# Patient Record
Sex: Female | Born: 1965 | Race: White | Hispanic: No | Smoking: Former smoker
Health system: Southern US, Community
[De-identification: ages and names within clinical notes are randomized; demographics above are authoritative.]

## PROBLEM LIST (undated history)

## (undated) DIAGNOSIS — R2231 Localized swelling, mass and lump, right upper limb: Secondary | ICD-10-CM

## (undated) DIAGNOSIS — Z87442 Personal history of urinary calculi: Secondary | ICD-10-CM

## (undated) DIAGNOSIS — Z9889 Other specified postprocedural states: Secondary | ICD-10-CM

## (undated) DIAGNOSIS — R112 Nausea with vomiting, unspecified: Secondary | ICD-10-CM

## (undated) DIAGNOSIS — K219 Gastro-esophageal reflux disease without esophagitis: Secondary | ICD-10-CM

## (undated) DIAGNOSIS — Z973 Presence of spectacles and contact lenses: Secondary | ICD-10-CM

## (undated) HISTORY — PX: BREAST EXCISIONAL BIOPSY: SUR124

## (undated) HISTORY — PX: CERVICAL BIOPSY  W/ LOOP ELECTRODE EXCISION: SUR135

---

## 1997-10-29 ENCOUNTER — Other Ambulatory Visit: Admission: RE | Admit: 1997-10-29 | Discharge: 1997-10-29 | Payer: Self-pay | Admitting: Gynecology

## 1998-07-20 HISTORY — PX: OTHER SURGICAL HISTORY: SHX169

## 1998-11-10 ENCOUNTER — Other Ambulatory Visit: Admission: RE | Admit: 1998-11-10 | Discharge: 1998-11-10 | Payer: Self-pay | Admitting: Gynecology

## 1999-11-11 ENCOUNTER — Encounter: Admission: RE | Admit: 1999-11-11 | Discharge: 1999-11-11 | Payer: Self-pay | Admitting: Gynecology

## 1999-11-11 ENCOUNTER — Other Ambulatory Visit: Admission: RE | Admit: 1999-11-11 | Discharge: 1999-11-11 | Payer: Self-pay | Admitting: Gynecology

## 1999-11-11 ENCOUNTER — Encounter: Payer: Self-pay | Admitting: Gynecology

## 2000-07-19 ENCOUNTER — Encounter (INDEPENDENT_AMBULATORY_CARE_PROVIDER_SITE_OTHER): Payer: Self-pay | Admitting: Specialist

## 2000-07-19 ENCOUNTER — Ambulatory Visit (HOSPITAL_COMMUNITY): Admission: RE | Admit: 2000-07-19 | Discharge: 2000-07-19 | Payer: Self-pay | Admitting: Surgery

## 2000-11-13 ENCOUNTER — Other Ambulatory Visit: Admission: RE | Admit: 2000-11-13 | Discharge: 2000-11-13 | Payer: Self-pay | Admitting: Gynecology

## 2000-11-13 ENCOUNTER — Encounter: Admission: RE | Admit: 2000-11-13 | Discharge: 2000-11-13 | Payer: Self-pay | Admitting: Gynecology

## 2000-11-13 ENCOUNTER — Encounter: Payer: Self-pay | Admitting: Gynecology

## 2001-05-18 ENCOUNTER — Encounter: Payer: Self-pay | Admitting: Gynecology

## 2001-05-18 ENCOUNTER — Encounter: Admission: RE | Admit: 2001-05-18 | Discharge: 2001-05-18 | Payer: Self-pay | Admitting: Gynecology

## 2001-06-22 ENCOUNTER — Ambulatory Visit (HOSPITAL_COMMUNITY): Admission: RE | Admit: 2001-06-22 | Discharge: 2001-06-22 | Payer: Self-pay | Admitting: General Surgery

## 2001-06-22 ENCOUNTER — Encounter: Payer: Self-pay | Admitting: General Surgery

## 2001-11-20 ENCOUNTER — Other Ambulatory Visit: Admission: RE | Admit: 2001-11-20 | Discharge: 2001-11-20 | Payer: Self-pay | Admitting: Gynecology

## 2002-12-10 ENCOUNTER — Other Ambulatory Visit: Admission: RE | Admit: 2002-12-10 | Discharge: 2002-12-10 | Payer: Self-pay | Admitting: Gynecology

## 2003-08-08 ENCOUNTER — Ambulatory Visit (HOSPITAL_COMMUNITY): Admission: RE | Admit: 2003-08-08 | Discharge: 2003-08-08 | Payer: Self-pay | Admitting: Internal Medicine

## 2003-12-15 ENCOUNTER — Other Ambulatory Visit: Admission: RE | Admit: 2003-12-15 | Discharge: 2003-12-15 | Payer: Self-pay | Admitting: Gynecology

## 2004-01-12 ENCOUNTER — Ambulatory Visit (HOSPITAL_BASED_OUTPATIENT_CLINIC_OR_DEPARTMENT_OTHER): Admission: RE | Admit: 2004-01-12 | Discharge: 2004-01-12 | Payer: Self-pay | Admitting: Gynecology

## 2004-01-12 ENCOUNTER — Ambulatory Visit (HOSPITAL_COMMUNITY): Admission: RE | Admit: 2004-01-12 | Discharge: 2004-01-12 | Payer: Self-pay | Admitting: Gynecology

## 2004-01-12 HISTORY — PX: OTHER SURGICAL HISTORY: SHX169

## 2004-12-29 ENCOUNTER — Other Ambulatory Visit: Admission: RE | Admit: 2004-12-29 | Discharge: 2004-12-29 | Payer: Self-pay | Admitting: Gynecology

## 2005-02-21 HISTORY — PX: LAPAROSCOPIC CHOLECYSTECTOMY: SUR755

## 2005-12-05 ENCOUNTER — Encounter: Admission: RE | Admit: 2005-12-05 | Discharge: 2005-12-05 | Payer: Self-pay | Admitting: Gynecology

## 2006-01-04 ENCOUNTER — Other Ambulatory Visit: Admission: RE | Admit: 2006-01-04 | Discharge: 2006-01-04 | Payer: Self-pay | Admitting: Gynecology

## 2006-02-21 HISTORY — PX: EXTRACORPOREAL SHOCK WAVE LITHOTRIPSY: SHX1557

## 2006-11-29 ENCOUNTER — Encounter: Admission: RE | Admit: 2006-11-29 | Discharge: 2006-11-29 | Payer: Self-pay | Admitting: Gynecology

## 2007-01-08 ENCOUNTER — Ambulatory Visit (HOSPITAL_COMMUNITY): Admission: RE | Admit: 2007-01-08 | Discharge: 2007-01-08 | Payer: Self-pay | Admitting: Urology

## 2007-01-11 ENCOUNTER — Other Ambulatory Visit: Admission: RE | Admit: 2007-01-11 | Discharge: 2007-01-11 | Payer: Self-pay | Admitting: Gynecology

## 2007-12-07 ENCOUNTER — Encounter: Admission: RE | Admit: 2007-12-07 | Discharge: 2007-12-07 | Payer: Self-pay | Admitting: Gynecology

## 2008-02-26 ENCOUNTER — Encounter: Admission: RE | Admit: 2008-02-26 | Discharge: 2008-02-26 | Payer: Self-pay | Admitting: Family Medicine

## 2008-12-10 ENCOUNTER — Encounter: Admission: RE | Admit: 2008-12-10 | Discharge: 2008-12-10 | Payer: Self-pay | Admitting: Gynecology

## 2008-12-16 ENCOUNTER — Encounter: Admission: RE | Admit: 2008-12-16 | Discharge: 2008-12-16 | Payer: Self-pay | Admitting: Gynecology

## 2009-01-08 ENCOUNTER — Ambulatory Visit (HOSPITAL_BASED_OUTPATIENT_CLINIC_OR_DEPARTMENT_OTHER): Admission: RE | Admit: 2009-01-08 | Discharge: 2009-01-08 | Payer: Self-pay | Admitting: Orthopedic Surgery

## 2009-01-08 HISTORY — PX: SHOULDER ARTHROSCOPY WITH ROTATOR CUFF REPAIR AND SUBACROMIAL DECOMPRESSION: SHX5686

## 2009-02-26 ENCOUNTER — Encounter: Admission: RE | Admit: 2009-02-26 | Discharge: 2009-02-26 | Payer: Self-pay | Admitting: Family Medicine

## 2009-12-18 ENCOUNTER — Encounter: Admission: RE | Admit: 2009-12-18 | Discharge: 2009-12-18 | Payer: Self-pay | Admitting: Gynecology

## 2010-03-14 ENCOUNTER — Encounter: Payer: Self-pay | Admitting: Gynecology

## 2010-05-26 LAB — POCT HEMOGLOBIN-HEMACUE: Hemoglobin: 14.9 g/dL (ref 12.0–15.0)

## 2010-07-09 NOTE — Op Note (Signed)
[name], [name]             ACCOUNT NO.:  [id number]   MEDICAL RECORD NO.:  [id number]          PATIENT TYPE:  AMB   LOCATION:  [facility]                         FACILITY:  [facility]   PHYSICIAN:  Gretta Cool, M.D. DATE OF BIRTH:  23-May-1965   DATE OF PROCEDURE:  01/12/2004  DATE OF DISCHARGE:                                 OPERATIVE REPORT   PREOPERATIVE DIAGNOSIS:  Pelvic pain.   POSTOPERATIVE DIAGNOSIS:  Stage 2 endometriosis with extensive surface  involvement, posterior aspect of the uterus, beneath both ovaries, on  posterior broad ligament, anterior pelvic peritoneum.   PROCEDURE:  1.  Diagnostic laparoscopy, photographic documentation, laser ablation by      CO2 laser of endometriosis with pin-point ablation and brush-type      ablation of endometriosis the entire pelvic surface posteriorly and      anteriorly.  2.  Tubal sterilization with Filshie clips.   SURGEON:  Gretta Cool, M.D.   ANESTHESIA:  General orotracheal.   DESCRIPTION OF PROCEDURE:  Under excellent anesthesia, as above, with the  patient prepped and draped in a lithotomy position with Hulka tenaculum  applied to her cervix, a subumbilical incision was made, and Veress cannula  introduced.  After adequate pneumoperitoneum, the laparoscope and trocars  were introduced, and pelvic organs visualized.  There was evidence of  extensive endometriosis surface disease with glistening, clear implants of  endometriosis and hemorrhagic implants of endometriosis on the peritoneal  surface.  There was extensive peritoneal tearing and defects related to  previous endometriosis.  Both posterior broad ligaments were involved  extensively in the cul de sac and posterior aspect of the uterus.  Anterior  peritoneum of the broad ligament was also extensively involved.  There were  a few implants on the fallopian tubes.  None on the ovaries were visible.  Both ovaries were inactive secondary to exogenous hormonal  contraception.  At this point, the CO2 laser was applied, and the implants treated by laser  ablation, first pin-point implants of endometriosis and then brush technique  to eliminate nonvisible islands of endometriosis, microscopic in size.  The  entire posterior pelvic peritoneum and anterior peritoneum were treated with  a laser brush technique.  The laser brush was decreased power to 5 watts so  as not to cause deep thermal injury.  At this point, the Filshie clips were  applied to the tubes and documentation of complete luminal occlusion was  obtained.  At this point, the procedure is terminated without complication.  Patient returned to the recovery room in excellent condition.      CWL/MEDQ  D:  01/12/2004  T:  01/12/2004  Job:  [id number]   cc:   [facility]

## 2010-07-09 NOTE — Op Note (Signed)
[name], [name]                       ACCOUNT NO.:  [id number]   MEDICAL RECORD NO.:  [id number]                   PATIENT TYPE:  AMB   LOCATION:  DAY                                  FACILITY:  [facility]   PHYSICIAN:  R. Roetta Sessions, M.D.              DATE OF BIRTH:  [birth year]   DATE OF PROCEDURE:  08/08/2003  DATE OF DISCHARGE:                                 OPERATIVE REPORT   PROCEDURES:  Diagnostic EGD, followed by colonoscopy, snare polypectomy,  ileoscopy.   INDICATIONS FOR PROCEDURE:  The patient is a 45 year old lady with right-  sided abdominal pain which radiates into her pelvis.  Actually, she is  describing to me right lower quadrant and right flank pain.  Her gallbladder  is out for biliary dyskinesia.  She occasionally is constipated.  With or  without constipation, she has these associated symptoms.  Prior CT April 7  revealed multiple left renal calculi but nothing to explain her right-sided  abdominal pain she has had.  She tells me she had a thorough gyn exam not  too long ago, and everything checked out.  Her appendix remains in situ.  EGD and colonoscopy are being done now to further evaluate her right-sided  abdominal pain.  The patient also was found to have a Hemoccult positive  stool back in April of this year.  This approach has been discussed with the  patient at length.  Potential risks, benefits, and alternatives have been  reviewed, questions answered.  Please see the dictated H&P for more  information.   PROCEDURE NOTE:  O2 saturations, blood pressure, pulse, respirations were  monitored throughout the entire procedure.   CONSCIOUS SEDATION:  1. Versed 5 mg IV.  2. Demerol 87.5 mg IV in divided doses.   INSTRUMENT:  Olympus video chip system.   FINDINGS:  EGD examination of the tubular esophagus revealed no mucosal  abnormality.  EG junction easily traversed.   STOMACH:  The gastric cavity was emptied, insufflated well with air.  Thorough examination of the gastric mucosa, including a retroflexed view of  the proximal stomach and esophagogastric junction demonstrated no  abnormalities.  The pylorus patent and easily traversed.  Examination of the  bulb and second portion revealed no abnormalities.   THERAPY/DIAGNOSTIC MANEUVERS PERFORMED:  None.   The patient tolerated the procedure well and was prepared for colonoscopy.  Digital rectal exam revealed no abnormalities or endoscopic findings.  The  prep was adequate.   RECTUM AND COLON:  Examination of the rectal mucosa including retroflexed  view in the anal verge revealed no abnormalities.   COLON:  Colonic mucosa was surveyed from the rectosigmoid junction through  the left transverse and right colon to the area of the appendiceal orifice,  ileocecal valve, and cecum.  These structures were well-seen and  photographed for the record.  The terminal ileum was also intubated.  From  this level, the scope  was slowly withdrawn, and all previously mentioned  mucosal surfaces were again seen.  The patient was noted to have a normal  colon except for a 5 mm pedunculated polyp at 35 cm which was removed with  cold snare.  The distal 10 cm of terminal ileum appeared entirely normal.  The patient tolerated both procedures well and was reacted in endoscopy.   IMPRESSION:  1. EGD:  Normal esophagus, stomach, D1, D2.  2. Colonoscopy findings:  Normal rectum.  Polyp in the left colon removed     with snare.  Remainder of the colonic mucosa and terminal ileum appeared     normal.   The patient's abdominal discomfort appears to be emanating from her right  lower quadrant.  At this point in time, given the above-mentioned work-up,  adhesive disease and/or chronic appendicitis may well be the culprit.   RECOMMENDATIONS:  1. Her next evaluation would really be consideration for diagnostic     laparoscopy and incidental appendectomy.  We will follow up on the path.  2.  Further recommendations to follow.      ___________________________________________                                            Jonathon Bellows, M.D.   RMR/MEDQ  D:  08/08/2003  T:  08/09/2003  Job:  [id number]   cc:   Sigmund Hazel, M.D.  [address]  [address] [place], [place] [address]  Fax: [phone]

## 2010-07-09 NOTE — Op Note (Signed)
[facility]  Patient:    [name], [name]                      MRN: [id number]. Date: 07/20/98 Attending:  Sandria Bales. Ezzard Standing, M.D. CC:         Gretta Cool, M.D.  Jamesetta Geralds, M.D., [facility]   Operative Report  DATE OF BIRTH:   04-26-1965  PREPROCEDURE DIAGNOSIS:  Right axillary mass.  POSTPROCEDURE DIAGNOSIS:  Right axillary mass, benign in appearance.  OPERATION:  Right axillary mass excision.  SURGEON:  Sandria Bales. Ezzard Standing, M.D.  ANESTHESIA:  General with an LMA with 10 cc of 0.25% Marcaine.  COMPLICATIONS:  None.  INDICATIONS FOR PROCEDURE:  Ms. Janice Norrie is a 45 year old white female who has had a mass in her right axilla which tends to come and go sometimes associated with her period, who has no specific abnormality of concern either by physical exam or ultrasound.  The patient wants the area excised.  DESCRIPTION OF PROCEDURE:  The patient was placed in a supine position. Before she was put to sleep, I had her palpate the area where she felt the mass.  She actually said that last week the mass was fairly large; this week it has gotten small again.  However, I can feel a discrete area.  I did mark where she pointed.  I performed ultrasound of the area.  I could not see any mass by ultrasound, though I saw at least one or two lymph nodes sort of deep in the axilla which were of normal-appearing size and shape.  I then painted her right axilla with Betadine and draped it after she was given a general LMA anesthesia.  Her dissection was then carried out, excising the block of breast tissue about 3 x 3 x 4 cm down toward the chest wall.  I did excise at least two lymph nodes, but these lymph nodes appeared grossly normal and were soft.  There was no other palpable abnormality, no knot in the tissue, and this was all sent off for pathology.  The wound was then irrigated.  Subcutaneous tissue closed with 3-0  Vicryl suture, skin closed with a 5-0 Monocryl suture, painted with tincture of benzoin and sterilely dressed.  The patient tolerated the procedure well and was transported to the recovery room in good condition. DD:  07/19/00 TD:  07/19/00 Job: [id number]

## 2010-07-09 NOTE — H&P (Signed)
[name], [name]                         ACCOUNT NO.:  [id number]   MEDICAL RECORD NO.:  [id number]                  PATIENT TYPE:   LOCATION:                                       FACILITY:   PHYSICIAN:  R. Roetta Sessions, M.D.              DATE OF BIRTH:  Nov 30, 1965   DATE OF ADMISSION:  DATE OF DISCHARGE:                                HISTORY & PHYSICAL   CHIEF COMPLAINT:  Chronic nausea and right upper quadrant abdominal pain.   HISTORY OF PRESENT ILLNESS:  The patient is a 45 year old Caucasian female  who initially presented to our office at the end of April of this year about  six weeks ago with a one year history of right sided abdominal pain.  She  states that the pain typically originates in the right lower quadrant and  radiates to the right upper quadrant as well as the right flank and right  mid back.  She had somewhat of an extensive workup including an abdominal  ultrasound which revealed left sided renal calculus involving the lower pole  measuring 7 mm.  This was also followed by a HIDA scan which revealed an  ejection fraction of 23%.  She was felt to have chronic cholecystitis and  underwent a laparoscopic cholecystectomy. Hot biopsy revealed chronic  cholecystitis.  She also underwent a CT scan on May 29, 2003 which revealed  multiple left renal calculi with the one most superior on the left measuring  2 x 2 mm.  The largest was in the mid kidney measuring 4.3 x 3.7 mm.  There  were a total of five calculi.  There was also fecal distention of the colon  consistent with constipation and the exam was otherwise normal.  Laboratory  work from June 17, 2003 revealed a normal CBC.   Today she continues to complain of persistent right upper quadrant pain.  She also reports chronic nausea although she denies having vomited recently.  She was initially having constipation when I saw her in April, however, she  has stopped Zelnorm for this as she notes bowel  movements now range between  constipation and altering loose stools.  She denies any melena or rectal  bleeding although during my initial exam back in April she was found to have  hemoccult positive stool.  She is complaining of some anorexia.  The pain  continues to radiate through to her back.  She denies any dysuria, hematuria  or increased urinary frequency.  She denies any fever or chills. She denies  any heartburn, indigestion or history of gastroesophageal reflux disease.  She reported underwent flexible sigmoidoscopy or possibly colonoscopy (she  is not sure) by Dr. Cleotis Nipper.  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  This was felt to be due to sciatica.   CURRENT MEDICATIONS:  1. Calcium with vitamin D 1200 mg daily.  2. Multivitamin daily.  3. Glucosamine  and chondroitin daily.  4. Ortho-Evra patch weekly.   ALLERGIES:  1. PENICILLIN.   PAST MEDICAL HISTORY:  1. Sciatica.   PAST SURGICAL HISTORY:  1. Tonsillectomy.  2. Bilateral LASIK eye surgery.  3. Left bone spur removed from left foot.  4. Right axillary with benign lymph node removal.   FAMILY HISTORY:  No known family history of carcinoma of the liver or  chronic GI problems. The patient is adopted and therefore there is no  maternal history other than the mother has a history of alcohol abuse.  Father deceased at age 6 secondary to spinal meningitis.  She has one  brother who she does not have contact with.   SOCIAL HISTORY:  The patient has been married for 15 years.  She has two  children, 8 and 12, both of whom are healthy. 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  She reports a remote ten year history of a half pack per  day tobacco use quitting approximately nine years ago.  She denies any  alcohol or drug use.   REVIEW OF SYMPTOMS:  See history of present illness.   PHYSICAL EXAMINATION:  VITAL SIGNS:  Weight is 111 pounds, temperature 97.5,  blood pressure 86/58,  pulse 62.  GENERAL:  The patient is a 45 year old Caucasian female who is alert,  oriented, pleasant and cooperative, thin and in no acute distress.  HEENT: The sclerae are clear and non-icteric.  The conjunctivae are pink.  The oropharynx is pink and moist without any lesions.  The neck is supple  without mass or thyromegaly.  CHEST:  Regular rate and rhythm with a normal S1 and S2.  There are no  murmurs, rubs or gallops.  ABDOMEN:  Positive bowel sounds times four.  No bruits are auscultated.  Soft, non-tender and non-distended without palpable mass or  hepatosplenomegaly.  No rebound tenderness or guarding.  She does have  multiple well healing right upper quadrant scars secondary to recent  cholecystectomy as well as multiple striae to her lower abdomen.  EXTREMITIES:  Good pulses bilaterally with no edema.   LABORATORY DATA:  See history of present illness.   ASSESSMENT:  The patient is a 45 year old Caucasian female with just over a  one year history of increasing right sided abdominal pain which does radiate  down into her pelvis as well as to her right flank and right mid back.  The  pain persists post cholecystectomy for chronic cholecystitis and biliary  dyskinesia.  Her symptoms are definitely atypical and bowel movements range  between constipation and diarrhea.  It is possible that she could have an  atypical presentation of inflammatory bowel disease.  She also continues to  have chronic nausea as well.  Further evaluation is necessary at this time.  She may also have an element of gastroesophageal reflux disease as well and  given her history of hemoccult positive stool on prior exam, we would  definitely recommend further evaluation to rule out inflammatory bowel  disease and colorectal carcinoma.   RECOMMENDATIONS:  1. We will schedule a colonoscopy and an esophagogastroduodenoscopy in the    near future by Dr. Jena Gauss in Dr. Patty Sermons absence.  2. We would like her to  try proton pump inhibitor therapy and we will give     her two weeks' worth of Protonix 40 mg daily samples.  3. A prescription is given for Phenergan 25 mg q.8h p.r.n. severe nausea,     #20 with  no refills.  I have warned the patient of sedation.  4. Further recommendations pending procedure.     _____________________________________  ___________________________________________  Nicholas Lose, N.P.               Jonathon Bellows, M.D.   KC/MEDQ  D:  08/04/2003  T:  08/04/2003  Job:  [id number]   cc:   Sigmund Hazel, M.D.  [address]  [address] [place], [place] [address]  Fax: [phone]

## 2010-10-18 ENCOUNTER — Other Ambulatory Visit: Payer: Self-pay | Admitting: Family Medicine

## 2010-10-18 DIAGNOSIS — Z1231 Encounter for screening mammogram for malignant neoplasm of breast: Secondary | ICD-10-CM

## 2010-11-30 LAB — URINE MICROSCOPIC-ADD ON

## 2010-11-30 LAB — URINALYSIS, ROUTINE W REFLEX MICROSCOPIC
Bilirubin Urine: NEGATIVE
Glucose, UA: NEGATIVE
Ketones, ur: NEGATIVE
Nitrite: NEGATIVE
Protein, ur: NEGATIVE
Specific Gravity, Urine: 1.021
Urobilinogen, UA: 0.2
pH: 5.5

## 2010-11-30 LAB — CBC
HCT: 39.8
Hemoglobin: 13.9
MCHC: 35
MCV: 89.3
Platelets: 294
RBC: 4.46
RDW: 12.7
WBC: 7.4

## 2010-11-30 LAB — PREGNANCY, URINE: Preg Test, Ur: NEGATIVE

## 2010-11-30 LAB — BASIC METABOLIC PANEL
BUN: 9
CO2: 28
Calcium: 9
Chloride: 105
Creatinine, Ser: 0.67
GFR calc Af Amer: >60
GFR calc non Af Amer: >60
Glucose, Bld: 81
Potassium: 3.6
Sodium: 139

## 2010-12-17 ENCOUNTER — Ambulatory Visit
Admission: RE | Admit: 2010-12-17 | Discharge: 2010-12-17 | Disposition: A | Discharge status: Home / Self Care | Payer: BC Managed Care – PPO | Source: Ambulatory Visit | Attending: Family Medicine | Admitting: Family Medicine

## 2010-12-17 DIAGNOSIS — Z1231 Encounter for screening mammogram for malignant neoplasm of breast: Secondary | ICD-10-CM

## 2010-12-20 ENCOUNTER — Ambulatory Visit: Payer: Self-pay

## 2011-06-29 IMAGING — MG MM SCREEN MAMMOGRAM BILATERAL
5 series · 5 of 5 positions shown · non-contrast
Comparison: none

DG SCREEN MAMMOGRAM BILATERAL
Bilateral CC and MLO view(s) were taken.
Technologist: Balkenhol, Sigvor(IDALMIS)
Prior study comparison: December 07, 2007, DG screen mammogram bilateral.

DIGITAL SCREENING MAMMOGRAM WITH CAD:
There are scattered fibroglandular densities.  A possible mass is noted in the left breast.  Spot 
compression views and possibly sonography are recommended for further evaluation.  In the right 
breast, no masses or malignant type calcifications are identified.  Compared with prior studies 
12-07-07.
Images were processed with CAD.

[R CC]
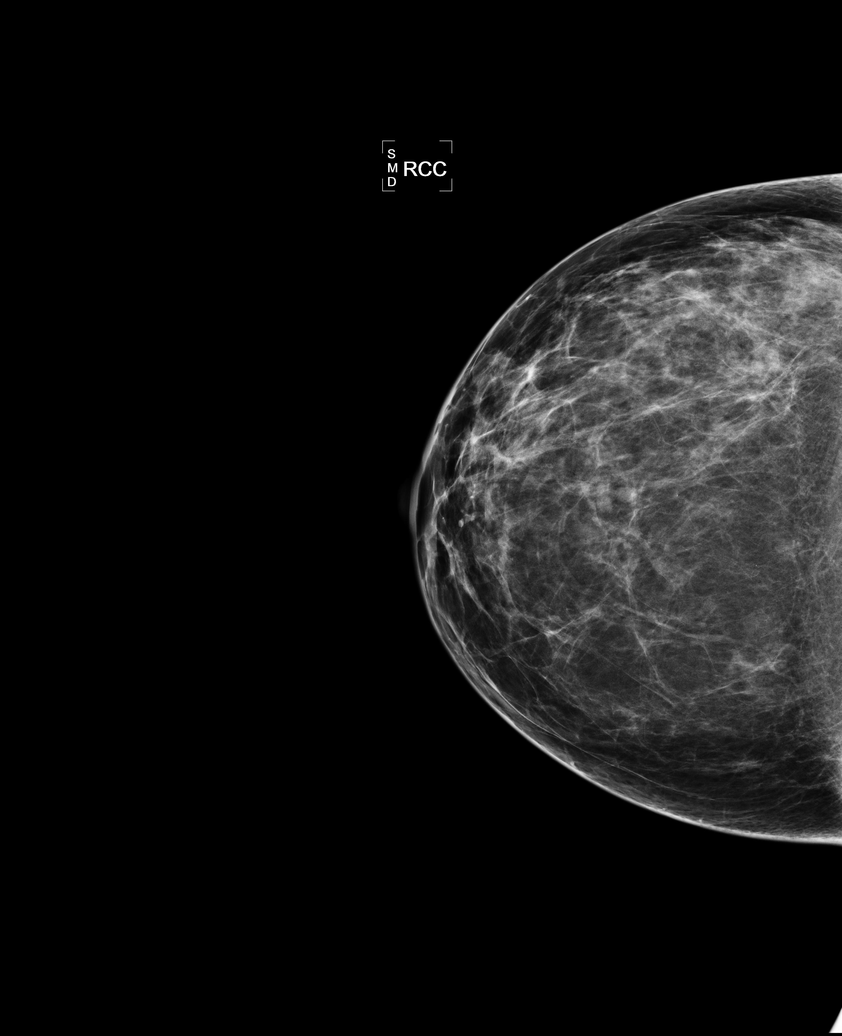

[L CC]
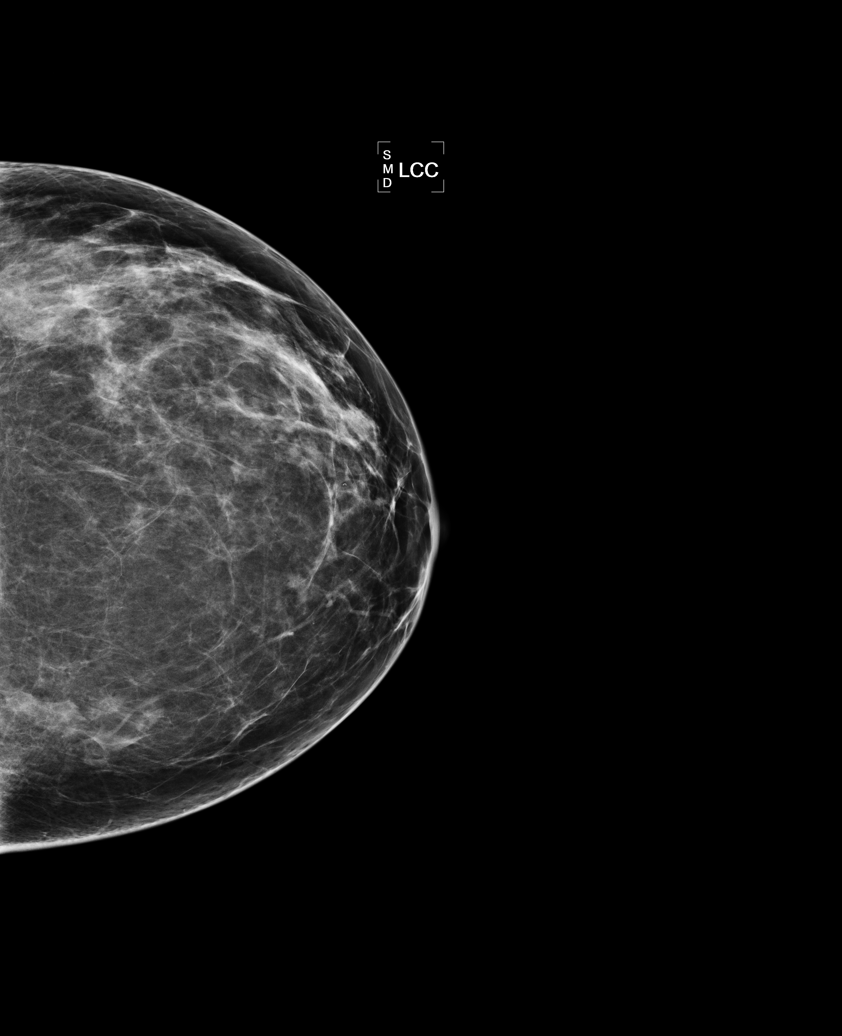

[L MLO]
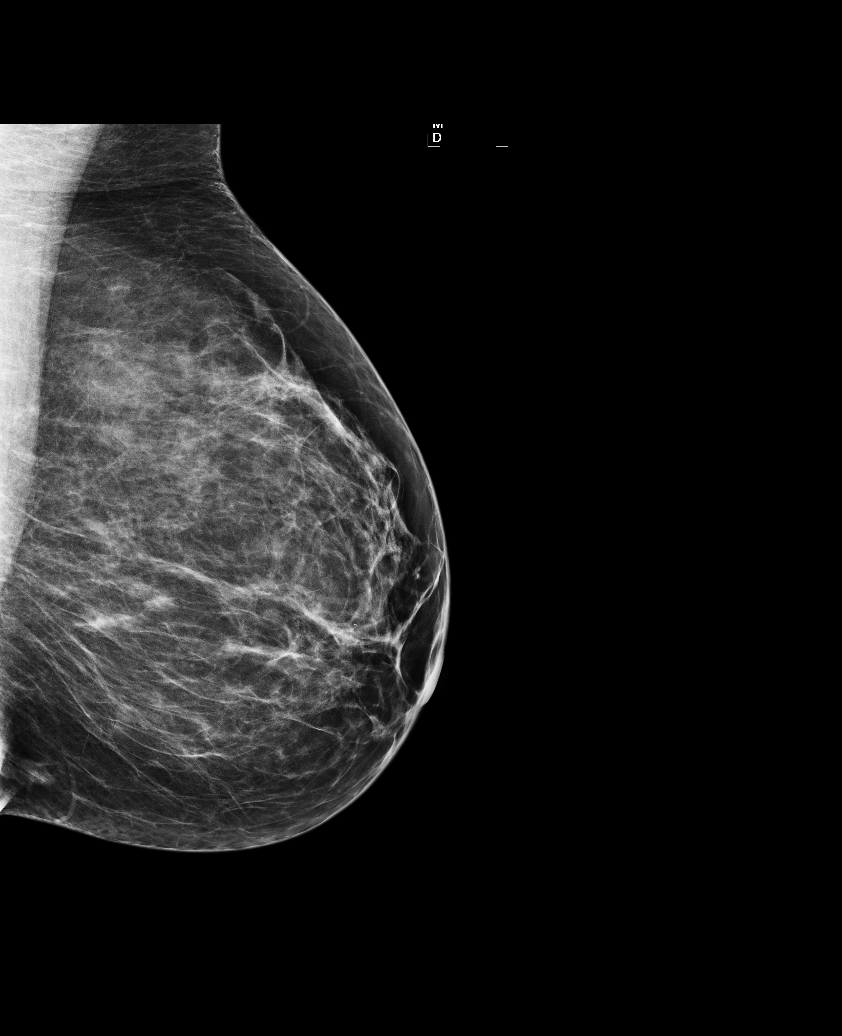

[R MLO (1 of 2)]
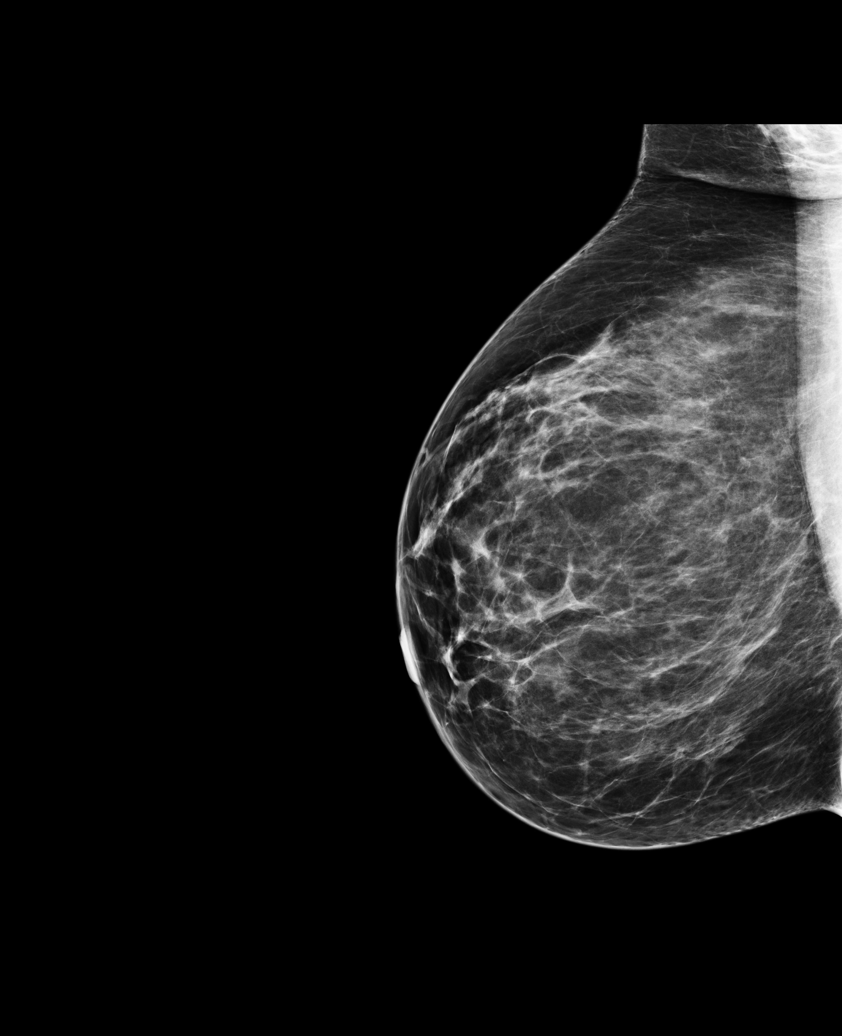

[R MLO (2 of 2)]
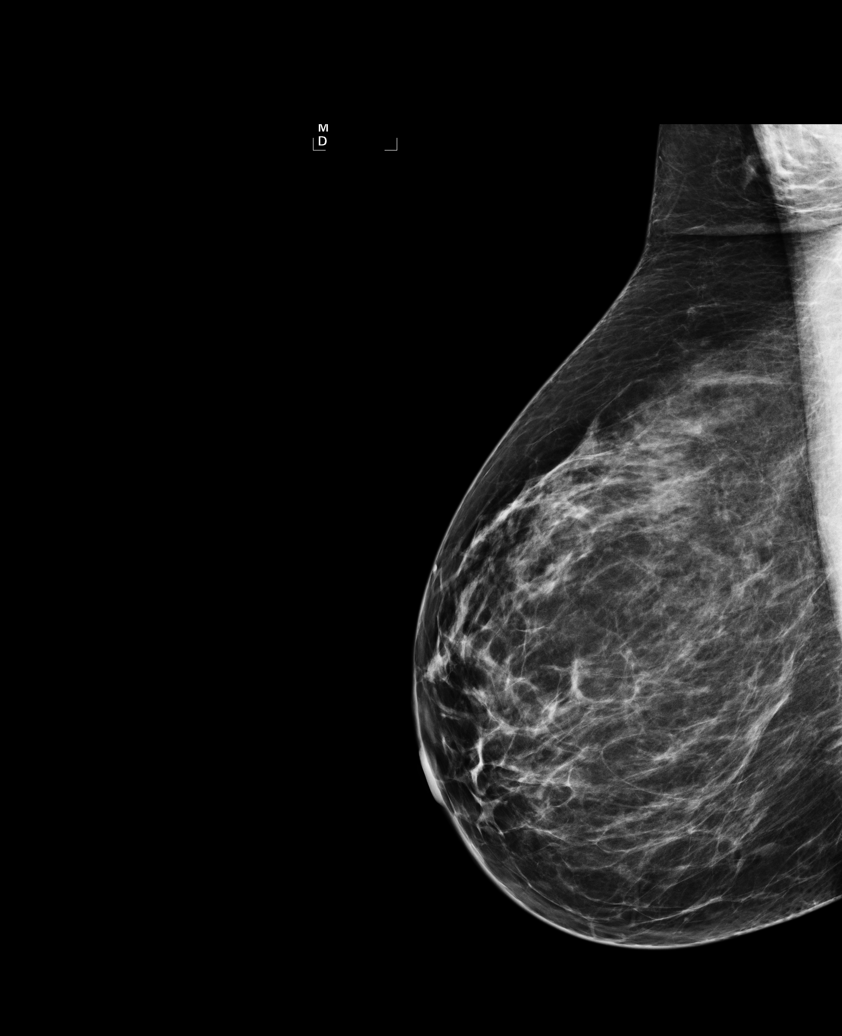

[5 of 5 positions shown; findings below may reference images not displayed]

IMPRESSION: Possible mass, left breast.  Additional evaluation is indicated.  The patient will be contacted for
additional studies and a supplementary report will follow.  No specific mammographic evidence of 
malignancy, right breast.

ASSESSMENT: Need additional imaging evaluation and/or prior mammograms for comparison - BI-RADS 0

Further imaging of the left breast.
,

## 2011-11-09 ENCOUNTER — Other Ambulatory Visit (HOSPITAL_COMMUNITY)
Admission: RE | Admit: 2011-11-09 | Discharge: 2011-11-09 | Disposition: A | Discharge status: Home / Self Care | Payer: BC Managed Care – PPO | Source: Ambulatory Visit | Attending: Obstetrics and Gynecology | Admitting: Obstetrics and Gynecology

## 2011-11-09 ENCOUNTER — Other Ambulatory Visit: Payer: Self-pay | Admitting: Obstetrics and Gynecology

## 2011-11-09 DIAGNOSIS — Z01419 Encounter for gynecological examination (general) (routine) without abnormal findings: Secondary | ICD-10-CM | POA: Insufficient documentation

## 2011-11-09 DIAGNOSIS — Z1151 Encounter for screening for human papillomavirus (HPV): Secondary | ICD-10-CM | POA: Insufficient documentation

## 2011-11-22 ENCOUNTER — Ambulatory Visit (INDEPENDENT_AMBULATORY_CARE_PROVIDER_SITE_OTHER): Payer: BC Managed Care – PPO | Admitting: Orthopedic Surgery

## 2011-11-22 ENCOUNTER — Ambulatory Visit (INDEPENDENT_AMBULATORY_CARE_PROVIDER_SITE_OTHER): Payer: BC Managed Care – PPO

## 2011-11-22 ENCOUNTER — Encounter: Payer: Self-pay | Admitting: Orthopedic Surgery

## 2011-11-22 VITALS — BP 100/70 | Ht 60.0 in | Wt 126.0 lb

## 2011-11-22 DIAGNOSIS — M25561 Pain in right knee: Secondary | ICD-10-CM

## 2011-11-22 DIAGNOSIS — M25569 Pain in unspecified knee: Secondary | ICD-10-CM

## 2011-11-22 DIAGNOSIS — M23329 Other meniscus derangements, posterior horn of medial meniscus, unspecified knee: Secondary | ICD-10-CM

## 2011-11-22 MED ORDER — DIAZEPAM 10 MG PO TABS: 10.0000 mg | ORAL_TABLET | Freq: Once | ORAL | Status: DC

## 2011-11-22 NOTE — Patient Instructions (Addendum)
You have been scheduled for an MRI scan.  Your insurance company requires a precertification prior to scheduling the MRI.  If the MRI scan is not approved we will let you know and make further treatment recommendations according to your insurance's guidelines.   We will schedule you for another  appointment to review the results and make further treatment recommendations  If your MRI shows that she have a torn medial meniscus. We will recommend arthroscopic knee surgery to remove the torn portion of tissue   Knee, Cartilage (Meniscus) Injury It is suspected that you have a torn cartilage (meniscus) in your knee. The menisci are made of tough cartilage, and fit between the surfaces of the thigh and leg bones. The menisci are "C"shaped and have a wedged profile. The wedged profile helps the stability of the joint by keeping the rounded femur surface from sliding off the flat tibial surface. The menisci are fed (nourished) by small blood vessels; but there is also a large area at the inner edge of the meniscus that does not have a good blood supply (avascular). This presents a problem when there is an injury to the meniscus, because areas without good blood supply heal poorly. As a result when there is a torn cartilage in the knee, surgery is often required to fix it. This is usually done with a surgical procedure less invasive than open surgery (arthroscopy). Some times open surgery of the knee is required if there is other damage. PURPOSE OF THE MENISCUS The medial meniscus rests on the medial tibial plateau. The tibia is the large bone in your lower leg (the shin bone). The medial tibial plateau is the upper end of the bone making up the inner part of your knee. The lateral meniscus serves the same purpose and is located on the outside of the knee. The menisci help to distribute your body weight across the knee joint; they act as shock absorbers. Without the meniscus present, the weight of your body would  be unevenly applied to the bones in your legs (the femur and tibia). The femur is the large bone in your thigh. This uneven weight distribution would cause increased wear and tear on the cartilage lining the joint surfaces, leading to early damage (arthritis) of these areas. The presence of the menisci cartilage is necessary for a healthy knee. PURPOSE OF THE KNEE CARTILAGE The knee joint is made up of three bones: the thigh bone (femur), the shin bone (tibia), and the knee cap (patella). The surfaces of these bones at the knee joint are covered with cartilage called articular cartilage. This smooth slippery surface allows the bones to slide against each other without causing bone damage. The meniscus sits between these cartilaginous surfaces of the bones. It distributes the weight evenly in the joints and helps with the stability of the joint (keeps the joint steady). HOME CARE INSTRUCTIONS  Use crutches and external braces as instructed.   Once home an ice pack applied to your injured knee may help with discomfort and keep the swelling down. An ice pack can be used for the first couple of days or as instructed.   Only take over-the-counter or prescription medicines for pain, discomfort, or fever as directed by your caregiver.   Call if you do not have relief of pain with medications or if there is increasing in pain.   Call if your foot becomes cold or blue.   You may resume normal diet and activities as directed.   Make  sure to keep your appointment with your follow-up caregiver. This injury may require further evaluation and treatment beyond the temporary treatment given today.  Document Released: 04/30/2002 Document Revised: 05/02/2011 Document Reviewed: 08/22/2008 [facility] Patient Information 2013 [place], [place].   Arthroscopic Procedure, Knee An arthroscopic procedure can find what is wrong with your knee. PROCEDURE Arthroscopy is a surgical technique that allows your orthopedic  surgeon to diagnose and treat your knee injury with accuracy. They will look into your knee through a small instrument. This is almost like a small (pencil sized) telescope. Because arthroscopy affects your knee less than open knee surgery, you can anticipate a more rapid recovery. Taking an active role by following your caregiver's instructions will help with rapid and complete recovery. Use crutches, rest, elevation, ice, and knee exercises as instructed. The length of recovery depends on various factors including type of injury, age, physical condition, medical conditions, and your rehabilitation. Your knee is the joint between the large bones (femur and tibia) in your leg. Cartilage covers these bone ends which are smooth and slippery and allow your knee to bend and move smoothly. Two menisci, thick, semi-lunar shaped pads of cartilage which form a rim inside the joint, help absorb shock and stabilize your knee. Ligaments bind the bones together and support your knee joint. Muscles move the joint, help support your knee, and take stress off the joint itself. Because of this all programs and physical therapy to rehabilitate an injured or repaired knee require rebuilding and strengthening your muscles. AFTER THE PROCEDURE  After the procedure, you will be moved to a recovery area until most of the effects of the medication have worn off. Your caregiver will discuss the test results with you.   Only take over-the-counter or prescription medicines for pain, discomfort, or fever as directed by your caregiver.  SEEK MEDICAL CARE IF:    You have increased bleeding from your wounds.   You see redness, swelling, or have increasing pain in your wounds.   You have pus coming from your wound.   You have an oral temperature above 102 F (38.9 C).   You notice a bad smell coming from the wound or dressing.   You have severe pain with any motion of your knee.  SEEK IMMEDIATE MEDICAL CARE IF:    You  develop a rash.   You have difficulty breathing.   You have any allergic problems.  Document Released: 02/05/2000 Document Revised: 05/02/2011 Document Reviewed: 08/29/2007 [facility] Patient Information 2013 [place], [place].

## 2011-11-22 NOTE — Progress Notes (Signed)
Patient ID: [name], female   DOB: [birth year], 46 y.o.   MRN: [id number] Chief Complaint  Patient presents with  . Knee Pain    Right knee pain, DOI  10/05/11 (FALL)      46 year old female, who was at a grocery store back in August of the 14th and she twisted her LEFT ankle. Fell onto her flexed and twisted RIGHT knee. He felt acute pain on the medial side of the knee, but was able to get up she walked off the ibuprofen and ice for 6 weeks and she still has medial knee pain. Pain when the 2 knees touch each other and pain with twisting, with a catching and giving way sensation associated with medial swelling and joint line pain.  Pain is sharp at 6/10.  Review of systems is negative  The patient's allergies are recorded, the medical and surgical history have been recorded, medications family history and social history have been recorded and all have been reviewed.  Vital signs are stable as recorded  General appearance is normal  The patient is alert and oriented x3  The patient's mood and affect are normal  Gait assessment: Seems to be walking normally The cardiovascular exam reveals normal pulses and temperature without edema swelling.  The lymphatic system is negative for palpable lymph nodes  The sensory exam is normal.  There are no pathologic reflexes.  Balance is normal.   Exam of the RIGHT knee and LEFT ankle.  LEFT ankle. Inspection no swelling full range of motion, stable. Drawer test, normal. Muscle tone normal skin.  RIGHT knee  No joint effusion. The medial swelling parapatellar tenderness medially. Medial joint line tenderness, severe. Positive McMurray sign. Painful flexion of the knee past 125, strength, normal stability. Normal skin normal.  LEFT knee evaluation for comparison. Inspection was normal. She had full range of motion. There. Ligaments were stable. Muscle tone was normal and skin was intact  Upper extremity exam  Inspection and  palpation revealed no abnormalities in the upper extremities.  Range of motion is full without contracture.  Motor exam is normal with grade 5 strength.  The joints are fully reduced without subluxation.  There is no atrophy or tremor and muscle tone is normal.  All joints are stable.   X-rays were done in the office: normal   Diagnosis medial meniscal tear  Plan MRI in preparation for surgery for arthroscopy

## 2011-11-28 ENCOUNTER — Telehealth: Payer: Self-pay | Admitting: Radiology

## 2011-11-28 NOTE — Telephone Encounter (Signed)
Patient has an MRI appointment at [facility] on 12-01-11 at 10:45. Patient has [employer], authorization [id number]. Patient will follow up back in the office for her results.

## 2011-12-01 ENCOUNTER — Ambulatory Visit (HOSPITAL_COMMUNITY)
Admission: RE | Admit: 2011-12-01 | Discharge: 2011-12-01 | Disposition: A | Discharge status: Home / Self Care | Payer: BC Managed Care – PPO | Source: Ambulatory Visit | Attending: Orthopedic Surgery | Admitting: Orthopedic Surgery

## 2011-12-01 DIAGNOSIS — M2240 Chondromalacia patellae, unspecified knee: Secondary | ICD-10-CM | POA: Insufficient documentation

## 2011-12-01 DIAGNOSIS — M25469 Effusion, unspecified knee: Secondary | ICD-10-CM | POA: Insufficient documentation

## 2011-12-01 DIAGNOSIS — M25569 Pain in unspecified knee: Secondary | ICD-10-CM | POA: Insufficient documentation

## 2011-12-01 DIAGNOSIS — M23329 Other meniscus derangements, posterior horn of medial meniscus, unspecified knee: Secondary | ICD-10-CM

## 2011-12-05 ENCOUNTER — Ambulatory Visit (INDEPENDENT_AMBULATORY_CARE_PROVIDER_SITE_OTHER): Payer: BC Managed Care – PPO | Admitting: Orthopedic Surgery

## 2011-12-05 ENCOUNTER — Encounter: Payer: Self-pay | Admitting: Orthopedic Surgery

## 2011-12-05 VITALS — BP 90/60 | Ht 60.0 in | Wt 126.0 lb

## 2011-12-05 DIAGNOSIS — M2340 Loose body in knee, unspecified knee: Secondary | ICD-10-CM

## 2011-12-05 NOTE — Patient Instructions (Addendum)
Arthroscopic Procedure, Knee An arthroscopic procedure can find what is wrong with your knee. PROCEDURE Arthroscopy is a surgical technique that allows your orthopedic surgeon to diagnose and treat your knee injury with accuracy. They will look into your knee through a small instrument. This is almost like a small (pencil sized) telescope. Because arthroscopy affects your knee less than open knee surgery, you can anticipate a more rapid recovery. Taking an active role by following your caregiver's instructions will help with rapid and complete recovery. Use crutches, rest, elevation, ice, and knee exercises as instructed. The length of recovery depends on various factors including type of injury, age, physical condition, medical conditions, and your rehabilitation. Your knee is the joint between the large bones (femur and tibia) in your leg. Cartilage covers these bone ends which are smooth and slippery and allow your knee to bend and move smoothly. Two menisci, thick, semi-lunar shaped pads of cartilage which form a rim inside the joint, help absorb shock and stabilize your knee. Ligaments bind the bones together and support your knee joint. Muscles move the joint, help support your knee, and take stress off the joint itself. Because of this all programs and physical therapy to rehabilitate an injured or repaired knee require rebuilding and strengthening your muscles. AFTER THE PROCEDURE  After the procedure, you will be moved to a recovery area until most of the effects of the medication have worn off. Your caregiver will discuss the test results with you.   Only take over-the-counter or prescription medicines for pain, discomfort, or fever as directed by your caregiver.    You have been scheduled for arthroscocpic knee surgery.  All surgeries carry some risk.  Remember you always have the option of continued nonsurgical treatment. However in this situation the risks vs. the benefits favor surgery as  the best treatment option. The risks of the surgery includes the following but is not limited to bleeding, infection, pulmonary embolus, death from anesthesia, nerve injury vascular injury or need for further surgery, continued pain.  Specific to this procedure the following risks and complications are rare but possible Stiffness, pain, weakness, giving out  I expect  recovery will be in 3-4 weeks some patients take 6 weeks.  You  will need physical therapy after the procedure  Stop any blood thinning medication: such as warfarin, coumadin, naprosyn, ibuprofen, advil, diclofenac, aspirin  

## 2011-12-05 NOTE — Progress Notes (Signed)
Patient ID: [name], female   DOB: 26-Mar-1965, 46 y.o.   MRN: [id number] Chief Complaint  Patient presents with  . Results    right knee MRI results    This is converted into a preoperative evaluation  The MRI results were reviewed and explained to the patient she is a loose body in the knee joint a chondral fissure in the patella with patella increased signal consistent with patellar contusion and she also complains of medial has anserine tenderness consistent with bursitis  See preoperative history and physical

## 2011-12-08 ENCOUNTER — Other Ambulatory Visit: Payer: Self-pay | Admitting: Family Medicine

## 2011-12-08 DIAGNOSIS — Z1231 Encounter for screening mammogram for malignant neoplasm of breast: Secondary | ICD-10-CM

## 2011-12-20 ENCOUNTER — Encounter (HOSPITAL_COMMUNITY): Payer: Self-pay

## 2011-12-28 NOTE — Patient Instructions (Addendum)
20 [name]  12/28/2011   Your procedure is scheduled on:  01/06/12  Report to [facility] at 06:15 AM.  Call this number if you have problems the morning of surgery: [phone]   Remember:   Do not eat or drink:After Midnight.  Take these medicines the morning of surgery with A SIP OF WATER: None   Do not wear jewelry, make-up or nail polish.  Do not wear lotions, powders, or perfumes.   Do not shave 48 hours prior to surgery. Men may shave face and neck.  Do not bring valuables to the hospital.  Contacts, dentures or bridgework may not be worn into surgery.  Leave suitcase in the car. After surgery it may be brought to your room.  For patients admitted to the hospital, checkout time is 11:00 AM the day of discharge.   Patients discharged the day of surgery will not be allowed to drive home.   Special Instructions: Shower using CHG 2 nights before surgery and the night before surgery.  If you shower the day of surgery use CHG.  Use special wash - you have one bottle of CHG for all showers.  You should use approximately 1/3 of the bottle for each shower.   Please read over the following fact sheets that you were given: Pain Booklet, MRSA Information, Surgical Site Infection Prevention, Anesthesia Post-op Instructions and Care and Recovery After Surgery    Arthroscopic Procedure, Knee An arthroscopic procedure can find what is wrong with your knee. PROCEDURE Arthroscopy is a surgical technique that allows your orthopedic surgeon to diagnose and treat your knee injury with accuracy. They will look into your knee through a small instrument. This is almost like a small (pencil sized) telescope. Because arthroscopy affects your knee less than open knee surgery, you can anticipate a more rapid recovery. Taking an active role by following your caregiver's instructions will help with rapid and complete recovery. Use crutches, rest, elevation, ice, and knee exercises as instructed. The  length of recovery depends on various factors including type of injury, age, physical condition, medical conditions, and your rehabilitation. Your knee is the joint between the large bones (femur and tibia) in your leg. Cartilage covers these bone ends which are smooth and slippery and allow your knee to bend and move smoothly. Two menisci, thick, semi-lunar shaped pads of cartilage which form a rim inside the joint, help absorb shock and stabilize your knee. Ligaments bind the bones together and support your knee joint. Muscles move the joint, help support your knee, and take stress off the joint itself. Because of this all programs and physical therapy to rehabilitate an injured or repaired knee require rebuilding and strengthening your muscles. AFTER THE PROCEDURE  After the procedure, you will be moved to a recovery area until most of the effects of the medication have worn off. Your caregiver will discuss the test results with you.  Only take over-the-counter or prescription medicines for pain, discomfort, or fever as directed by your caregiver. SEEK MEDICAL CARE IF:   You have increased bleeding from your wounds.  You see redness, swelling, or have increasing pain in your wounds.  You have pus coming from your wound.  You have an oral temperature above 102 F (38.9 C).  You notice a bad smell coming from the wound or dressing.  You have severe pain with any motion of your knee. SEEK IMMEDIATE MEDICAL CARE IF:   You develop a rash.  You have difficulty breathing.  You have any allergic problems. Document Released: 02/05/2000 Document Revised: 05/02/2011 Document Reviewed: 08/29/2007 [facility] Patient Information 2013 [place], [place].    PATIENT INSTRUCTIONS POST-ANESTHESIA  IMMEDIATELY FOLLOWING SURGERY:  Do not drive or operate machinery for the first twenty four hours after surgery.  Do not make any important decisions for twenty four hours after surgery or while taking  narcotic pain medications or sedatives.  If you develop intractable nausea and vomiting or a severe headache please notify your doctor immediately.  FOLLOW-UP:  Please make an appointment with your surgeon as instructed. You do not need to follow up with anesthesia unless specifically instructed to do so.  WOUND CARE INSTRUCTIONS (if applicable):  Keep a dry clean dressing on the anesthesia/puncture wound site if there is drainage.  Once the wound has quit draining you may leave it open to air.  Generally you should leave the bandage intact for twenty four hours unless there is drainage.  If the epidural site drains for more than 36-48 hours please call the anesthesia department.  QUESTIONS?:  Please feel free to call your physician or the hospital operator if you have any questions, and they will be happy to assist you.

## 2011-12-29 ENCOUNTER — Encounter (HOSPITAL_COMMUNITY)
Admission: RE | Admit: 2011-12-29 | Discharge: 2011-12-29 | Disposition: A | Discharge status: Home / Self Care | Payer: BC Managed Care – PPO | Source: Ambulatory Visit | Attending: Orthopedic Surgery | Admitting: Orthopedic Surgery

## 2011-12-29 ENCOUNTER — Encounter (HOSPITAL_COMMUNITY): Payer: Self-pay

## 2011-12-29 HISTORY — DX: Nausea with vomiting, unspecified: R11.2

## 2011-12-29 HISTORY — DX: Other specified postprocedural states: Z98.890

## 2011-12-29 LAB — HEMOGLOBIN AND HEMATOCRIT, BLOOD
HCT: 43.7 % (ref 36.0–46.0)
Hemoglobin: 15.3 g/dL — ABNORMAL HIGH (ref 12.0–15.0)

## 2011-12-29 LAB — SURGICAL PCR SCREEN
MRSA, PCR: NEGATIVE
Staphylococcus aureus: NEGATIVE

## 2011-12-29 LAB — HCG, SERUM, QUALITATIVE: Preg, Serum: NEGATIVE

## 2011-12-30 ENCOUNTER — Telehealth: Payer: Self-pay | Admitting: Orthopedic Surgery

## 2011-12-30 NOTE — Telephone Encounter (Signed)
Places called to [employer],concerning outpatient surgery CPT [phone] for [employer].  Per Lindalou Hose at [facility] no prior authorization required.

## 2012-01-05 NOTE — H&P (Signed)
Chief Complaint   Patient presents with   .  Knee Pain     Right knee pain, DOI 10/05/11 (FALL)    46 year old female, who was at a grocery store back in August of the 14th and she twisted her LEFT ankle. Fell onto her flexed and twisted RIGHT knee. He felt acute pain on the medial side of the knee, but was able to get up she walked off the ibuprofen and ice for 6 weeks and she still has medial knee pain. Pain when the 2 knees touch each other and pain with twisting, with a catching and giving way sensation associated with medial swelling and joint line pain.  Pain is sharp at 6/10.   Review of systems is negative   The patient's allergies are recorded, the medical and surgical history have been recorded, medications family history and social history have been recorded and all have been reviewed.  Past Medical History  Diagnosis Date  . PONV (postoperative nausea and vomiting)   . Kidney stones    Past Surgical History  Procedure Date  . Rotator cuff repair 12/2008    right , [facility]   . Cholecystectomy   . Bone spur     left  . Tubal ligation   . Dilation and curettage of uterus   . Lasik   . Lithotripsy    History   Social History  . Marital Status: Divorced    Spouse Name: N/A    Number of Children: N/A  . Years of Education: 12   Occupational History  .     Social History Main Topics  . Smoking status: Never Smoker   . Smokeless tobacco: Not on file  . Alcohol Use: Yes     Comment: 2X PER WEEK  . Drug Use: No  . Sexually Active: Not on file   Other Topics Concern  . Not on file   Social History Narrative  . No narrative on file   Family History  Problem Relation Age of Onset  . Cancer     Vital signs are stable as recorded  General appearance is normal  The patient is alert and oriented x3  The patient's mood and affect are normal  Gait assessment: Seems to be walking normally  The cardiovascular exam reveals normal pulses and temperature without edema  swelling.  The lymphatic system is negative for palpable lymph nodes  The sensory exam is normal.  There are no pathologic reflexes.  Balance is normal.   Upper extremity exam Inspection and palpation revealed no abnormalities in the upper extremities.  Range of motion is full without contracture. Motor exam is normal with grade 5 strength. The joints are fully reduced without subluxation. There is no atrophy or tremor and muscle tone is normal.  All joints are stable.  Exam of the RIGHT knee and LEFT ankle.  LEFT ankle. Inspection no swelling full range of motion, stable. Drawer test, normal. Muscle tone normal skin.   RIGHT knee  No joint effusion. The medial swelling parapatellar tenderness medially. Medial joint line tenderness, severe. Positive McMurray sign. Painful flexion of the knee past 125, strength, normal stability. Normal skin normal.  LEFT knee evaluation for comparison. Inspection was normal. She had full range of motion. There. Ligaments were stable. Muscle tone was normal and skin was intact   X-rays were done in the office: normal  MRI LOOSE BODY   IMPRESSION:  1. Intact ligamentous structures. Mucoid degeneration of the ACL  is noted.  2. No meniscal tear.  3. Mild chondromalacia and possible chondral contusion involving  the patellar cartilage.  4. No joint effusion or Baker's cyst.  5. Mild diffuse edema in the patella could be a resolving bone  contusion.  6. Indeterminate 5-mm soft tissue lesion in the anteromedial joint  space. This could be a small loose cartilaginous fragment or a  prominent plica or capsular fold.   Diagnosis  LOOSE BODY RIGHT KNEE  PLAN:  SARK REMOVE LOOSE BODY

## 2012-01-06 ENCOUNTER — Ambulatory Visit (HOSPITAL_COMMUNITY): Payer: BC Managed Care – PPO | Admitting: Anesthesiology

## 2012-01-06 ENCOUNTER — Encounter (HOSPITAL_COMMUNITY)
Admission: RE | Disposition: A | Discharge status: Home / Self Care | Payer: Self-pay | Source: Ambulatory Visit | Attending: Orthopedic Surgery

## 2012-01-06 ENCOUNTER — Ambulatory Visit (HOSPITAL_COMMUNITY)
Admission: RE | Admit: 2012-01-06 | Discharge: 2012-01-06 | Disposition: A | Discharge status: Home / Self Care | Payer: BC Managed Care – PPO | Source: Ambulatory Visit | Attending: Orthopedic Surgery | Admitting: Orthopedic Surgery

## 2012-01-06 ENCOUNTER — Encounter (HOSPITAL_COMMUNITY): Payer: Self-pay | Admitting: *Deleted

## 2012-01-06 ENCOUNTER — Encounter (HOSPITAL_COMMUNITY): Payer: Self-pay | Admitting: Anesthesiology

## 2012-01-06 DIAGNOSIS — IMO0002 Reserved for concepts with insufficient information to code with codable children: Secondary | ICD-10-CM

## 2012-01-06 DIAGNOSIS — M7051 Other bursitis of knee, right knee: Secondary | ICD-10-CM

## 2012-01-06 DIAGNOSIS — M25569 Pain in unspecified knee: Secondary | ICD-10-CM

## 2012-01-06 HISTORY — PX: KNEE ARTHROSCOPY: SHX127

## 2012-01-06 HISTORY — PX: INJECTION KNEE: SHX2446

## 2012-01-06 SURGERY — ARTHROSCOPY, KNEE
Anesthesia: General | Site: Knee | Laterality: Right | Wound class: Clean

## 2012-01-06 MED ORDER — METHYLPREDNISOLONE ACETATE 40 MG/ML IJ SUSP
INTRAMUSCULAR | Status: AC
  Filled 2012-01-06: qty 5

## 2012-01-06 MED ORDER — HYDROCODONE-ACETAMINOPHEN 5-325 MG PO TABS
ORAL_TABLET | ORAL | Status: AC
  Filled 2012-01-06: qty 1

## 2012-01-06 MED ORDER — VANCOMYCIN HCL 1000 MG IV SOLR
1000.0000 mg | INTRAVENOUS | Status: DC | PRN
  Administered 2012-01-06: 1000 mg via INTRAVENOUS

## 2012-01-06 MED ORDER — PROPOFOL 10 MG/ML IV EMUL
INTRAVENOUS | Status: AC
  Filled 2012-01-06: qty 20

## 2012-01-06 MED ORDER — PROPOFOL 10 MG/ML IV EMUL
INTRAVENOUS | Status: DC | PRN
  Administered 2012-01-06: 130 mg via INTRAVENOUS

## 2012-01-06 MED ORDER — MIDAZOLAM HCL 2 MG/2ML IJ SOLN
1.0000 mg | INTRAMUSCULAR | Status: DC | PRN
Start: 2012-01-06 — End: 2012-01-06
  Administered 2012-01-06: 2 mg via INTRAVENOUS

## 2012-01-06 MED ORDER — SODIUM CHLORIDE 0.9 % IR SOLN
Status: DC | PRN
  Administered 2012-01-06: 1000 mL

## 2012-01-06 MED ORDER — SCOPOLAMINE 1 MG/3DAYS TD PT72
MEDICATED_PATCH | TRANSDERMAL | Status: AC
  Filled 2012-01-06: qty 1

## 2012-01-06 MED ORDER — METHYLPREDNISOLONE ACETATE 40 MG/ML IJ SUSP
40.0000 mg | Freq: Once | INTRAMUSCULAR | Status: DC
Start: 2012-01-06 — End: 2012-01-06

## 2012-01-06 MED ORDER — BUPIVACAINE-EPINEPHRINE PF 0.5-1:200000 % IJ SOLN
INTRAMUSCULAR | Status: DC | PRN
  Administered 2012-01-06: 60 mL

## 2012-01-06 MED ORDER — ONDANSETRON HCL 4 MG/2ML IJ SOLN
4.0000 mg | Freq: Once | INTRAMUSCULAR | Status: AC
  Administered 2012-01-06: 4 mg via INTRAVENOUS

## 2012-01-06 MED ORDER — CHLORHEXIDINE GLUCONATE 4 % EX LIQD: 60.0000 mL | Freq: Once | CUTANEOUS | Status: DC

## 2012-01-06 MED ORDER — SCOPOLAMINE 1 MG/3DAYS TD PT72: 1.0000 | MEDICATED_PATCH | Freq: Once | TRANSDERMAL | Status: DC

## 2012-01-06 MED ORDER — DEXAMETHASONE SODIUM PHOSPHATE 4 MG/ML IJ SOLN
INTRAMUSCULAR | Status: AC
  Filled 2012-01-06: qty 1

## 2012-01-06 MED ORDER — FENTANYL CITRATE 0.05 MG/ML IJ SOLN
25.0000 ug | INTRAMUSCULAR | Status: DC | PRN
  Administered 2012-01-06 (×2): 50 ug via INTRAVENOUS

## 2012-01-06 MED ORDER — VANCOMYCIN HCL IN DEXTROSE 1-5 GM/200ML-% IV SOLN: 1000.0000 mg | INTRAVENOUS | Status: DC

## 2012-01-06 MED ORDER — FENTANYL CITRATE 0.05 MG/ML IJ SOLN
INTRAMUSCULAR | Status: AC
  Filled 2012-01-06: qty 2

## 2012-01-06 MED ORDER — KETOROLAC TROMETHAMINE 30 MG/ML IJ SOLN
30.0000 mg | Freq: Once | INTRAMUSCULAR | Status: AC
  Administered 2012-01-06: 30 mg via INTRAVENOUS

## 2012-01-06 MED ORDER — LACTATED RINGERS IV SOLN
INTRAVENOUS | Status: DC | PRN
  Administered 2012-01-06: 07:00:00 via INTRAVENOUS

## 2012-01-06 MED ORDER — MIDAZOLAM HCL 2 MG/2ML IJ SOLN
INTRAMUSCULAR | Status: AC
  Filled 2012-01-06: qty 2

## 2012-01-06 MED ORDER — ONDANSETRON HCL 4 MG/2ML IJ SOLN
INTRAMUSCULAR | Status: AC
  Filled 2012-01-06: qty 2

## 2012-01-06 MED ORDER — DEXAMETHASONE SODIUM PHOSPHATE 4 MG/ML IJ SOLN
4.0000 mg | Freq: Once | INTRAMUSCULAR | Status: AC
  Administered 2012-01-06: 4 mg via INTRAVENOUS

## 2012-01-06 MED ORDER — LACTATED RINGERS IV SOLN
INTRAVENOUS | Status: DC
  Administered 2012-01-06: 1000 mL via INTRAVENOUS

## 2012-01-06 MED ORDER — BUPIVACAINE-EPINEPHRINE PF 0.5-1:200000 % IJ SOLN
INTRAMUSCULAR | Status: AC
  Filled 2012-01-06: qty 20

## 2012-01-06 MED ORDER — HYDROCODONE-ACETAMINOPHEN 5-325 MG PO TABS: 1.0000 | ORAL_TABLET | Freq: Four times a day (QID) | ORAL | Status: DC | PRN

## 2012-01-06 MED ORDER — EPINEPHRINE HCL 1 MG/ML IJ SOLN
INTRAMUSCULAR | Status: DC | PRN
  Administered 2012-01-06 (×3)

## 2012-01-06 MED ORDER — LIDOCAINE HCL (CARDIAC) 10 MG/ML IV SOLN
INTRAVENOUS | Status: DC | PRN
  Administered 2012-01-06: 50 mg via INTRAVENOUS

## 2012-01-06 MED ORDER — ARTIFICIAL TEARS OP OINT
TOPICAL_OINTMENT | OPHTHALMIC | Status: AC
  Filled 2012-01-06: qty 3.5

## 2012-01-06 MED ORDER — HYDROCODONE-ACETAMINOPHEN 5-325 MG PO TABS
1.0000 | ORAL_TABLET | Freq: Once | ORAL | Status: AC
  Administered 2012-01-06: 1 via ORAL

## 2012-01-06 MED ORDER — FENTANYL CITRATE 0.05 MG/ML IJ SOLN
INTRAMUSCULAR | Status: DC | PRN
  Administered 2012-01-06 (×3): 25 ug via INTRAVENOUS

## 2012-01-06 MED ORDER — KETOROLAC TROMETHAMINE 30 MG/ML IJ SOLN
INTRAMUSCULAR | Status: AC
  Filled 2012-01-06: qty 1

## 2012-01-06 MED ORDER — ONDANSETRON HCL 4 MG/2ML IJ SOLN: 4.0000 mg | Freq: Once | INTRAMUSCULAR | Status: DC | PRN

## 2012-01-06 MED ORDER — EPINEPHRINE HCL 1 MG/ML IJ SOLN
INTRAMUSCULAR | Status: AC
  Filled 2012-01-06: qty 5

## 2012-01-06 MED ORDER — VANCOMYCIN HCL IN DEXTROSE 1-5 GM/200ML-% IV SOLN
INTRAVENOUS | Status: AC
  Filled 2012-01-06: qty 200

## 2012-01-06 MED ORDER — PROMETHAZINE HCL 12.5 MG PO TABS: 12.5000 mg | ORAL_TABLET | Freq: Four times a day (QID) | ORAL | Status: DC | PRN

## 2012-01-06 MED ORDER — LIDOCAINE HCL (PF) 1 % IJ SOLN
INTRAMUSCULAR | Status: AC
  Filled 2012-01-06: qty 5

## 2012-01-06 SURGICAL SUPPLY — 55 items
ARTHROWAND PARAGON T2 (SURGICAL WAND)
BAG HAMPER (MISCELLANEOUS) ×2 IMPLANT
BANDAGE ELASTIC 6 VELCRO NS (GAUZE/BANDAGES/DRESSINGS) ×2 IMPLANT
BLADE AGGRESSIVE PLUS 4.0 (BLADE) ×2 IMPLANT
BLADE SURG SZ11 CARB STEEL (BLADE) ×2 IMPLANT
CHLORAPREP W/TINT 26ML (MISCELLANEOUS) ×4 IMPLANT
CLOTH BEACON ORANGE TIMEOUT ST (SAFETY) ×2 IMPLANT
COOLER CRYO IC GRAV AND TUBE (ORTHOPEDIC SUPPLIES) ×2 IMPLANT
COVER PROBE W GEL 5X96 (DRAPES) ×2 IMPLANT
CUFF CRYO KNEE LG 20X31 COOLER (ORTHOPEDIC SUPPLIES) IMPLANT
CUFF CRYO KNEE18X23 MED (MISCELLANEOUS) ×1 IMPLANT
CUFF TOURNIQUET SINGLE 34IN LL (TOURNIQUET CUFF) ×1 IMPLANT
CUFF TOURNIQUET SINGLE 44IN (TOURNIQUET CUFF) IMPLANT
CUTTER ANGLED DBL BITE 4.5 (BURR) IMPLANT
DECANTER SPIKE VIAL GLASS SM (MISCELLANEOUS) ×4 IMPLANT
GAUZE SPONGE 4X4 16PLY XRAY LF (GAUZE/BANDAGES/DRESSINGS) ×2 IMPLANT
GAUZE XEROFORM 5X9 LF (GAUZE/BANDAGES/DRESSINGS) ×2 IMPLANT
GLOVE BIOGEL PI IND STRL 7.0 (GLOVE) IMPLANT
GLOVE BIOGEL PI INDICATOR 7.0 (GLOVE) ×1
GLOVE EXAM NITRILE MD LF STRL (GLOVE) ×1 IMPLANT
GLOVE SKINSENSE NS SZ8.0 LF (GLOVE) ×1
GLOVE SKINSENSE STRL SZ8.0 LF (GLOVE) ×1 IMPLANT
GLOVE SS BIOGEL STRL SZ 6.5 (GLOVE) IMPLANT
GLOVE SS N UNI LF 8.5 STRL (GLOVE) ×2 IMPLANT
GLOVE SUPERSENSE BIOGEL SZ 6.5 (GLOVE) ×1
GOWN STRL REIN XL XLG (GOWN DISPOSABLE) ×4 IMPLANT
HLDR LEG FOAM (MISCELLANEOUS) ×1 IMPLANT
IV NS IRRIG 3000ML ARTHROMATIC (IV SOLUTION) ×4 IMPLANT
KIT BLADEGUARD II DBL (SET/KITS/TRAYS/PACK) ×2 IMPLANT
KIT ROOM TURNOVER AP CYSTO (KITS) ×2 IMPLANT
LEG HOLDER FOAM (MISCELLANEOUS) ×1
MANIFOLD NEPTUNE II (INSTRUMENTS) ×2 IMPLANT
MARKER SKIN DUAL TIP RULER LAB (MISCELLANEOUS) ×2 IMPLANT
NDL HYPO 18GX1.5 BLUNT FILL (NEEDLE) ×1 IMPLANT
NDL HYPO 21X1.5 SAFETY (NEEDLE) ×1 IMPLANT
NDL SPNL 18GX3.5 QUINCKE PK (NEEDLE) ×1 IMPLANT
NEEDLE HYPO 18GX1.5 BLUNT FILL (NEEDLE) ×2 IMPLANT
NEEDLE HYPO 21X1.5 SAFETY (NEEDLE) ×2 IMPLANT
NEEDLE SPNL 18GX3.5 QUINCKE PK (NEEDLE) ×2 IMPLANT
NS IRRIG 1000ML POUR BTL (IV SOLUTION) ×2 IMPLANT
PACK ARTHRO LIMB DRAPE STRL (MISCELLANEOUS) ×1 IMPLANT
PAD ABD 5X9 TENDERSORB (GAUZE/BANDAGES/DRESSINGS) ×2 IMPLANT
PAD ARMBOARD 7.5X6 YLW CONV (MISCELLANEOUS) ×2 IMPLANT
PADDING CAST COTTON 6X4 STRL (CAST SUPPLIES) ×2 IMPLANT
SET ARTHROSCOPY INST (INSTRUMENTS) ×2 IMPLANT
SET ARTHROSCOPY PUMP TUBE (IRRIGATION / IRRIGATOR) ×2 IMPLANT
SET BASIN LINEN APH (SET/KITS/TRAYS/PACK) ×2 IMPLANT
SPONGE GAUZE 4X4 12PLY (GAUZE/BANDAGES/DRESSINGS) ×2 IMPLANT
SUT ETHILON 3 0 FSL (SUTURE) IMPLANT
SYR 30ML LL (SYRINGE) ×2 IMPLANT
SYRINGE 10CC LL (SYRINGE) ×2 IMPLANT
WAND 50 DEG COVAC W/CORD (SURGICAL WAND) IMPLANT
WAND 90 DEG TURBOVAC W/CORD (SURGICAL WAND) IMPLANT
WAND ARTHRO PARAGON T2 (SURGICAL WAND) IMPLANT
YANKAUER SUCT BULB TIP 10FT TU (MISCELLANEOUS) ×6 IMPLANT

## 2012-01-06 NOTE — Brief Op Note (Signed)
01/06/2012  8:11 AM  PATIENT:  [name]  46 y.o. female  PRE-OPERATIVE DIAGNOSIS:  Loose Body Right Knee, Bursitis Right Knee  POST-OPERATIVE DIAGNOSIS:  Right Knee Pain, Bursitis Right Knee  FINDINGS: A diagnostic arthroscopy yielded no abnormal joint surfaces. The patella had firm cartilage with no chondromalacia or fissuring. There is no evidence of loose body in the joint. Ligaments were stable and intact  PROCEDURE:  Procedure(s) (LRB) with comments: ARTHROSCOPY KNEE (Right) KNEE INJECTION (Right)  SURGEON:  Surgeon(s) and Role:    * Vickki Hearing, MD - Primary  PHYSICIAN ASSISTANT:   ASSISTANTS: none   ANESTHESIA:   general  EBL:  Total I/O In: 700 [I.V.:700] Out: -   BLOOD ADMINISTERED:none  DRAINS: none   LOCAL MEDICATIONS USED:  MARCAINE   , Amount: 60 ml and OTHER EPI IN JOINT  40 MG DEPOMEDROL AT PES BURSA  SPECIMEN:  No Specimen  DISPOSITION OF SPECIMEN:  N/A  COUNTS:  YES  TOURNIQUET:  * Missing tourniquet times found for documented tourniquets in log:  16109 *  DICTATION: .Dragon Dictation  PLAN OF CARE: Discharge to home after PACU  PATIENT DISPOSITION:  PACU - hemodynamically stable.   Delay start of Pharmacological VTE agent (>24hrs) due to surgical blood loss or risk of bleeding: not applicable

## 2012-01-06 NOTE — Op Note (Signed)
Operative report  Date of surgery 01/06/2012  Preop diagnosis loose body right knee, secondary diagnosis pes anserine bursitis  Postop diagnosis knee pain, pes anserine bursitis  Procedure arthroscopy right knee Procedure #2 injection right peds anserine bursa  Surgeon Romeo Apple  Assistants none  Anesthesia Gen.  Operative findings normal knee surfaces normal ligaments no loose body. There is no chondral fissuring in the patella chondral surfaces were firm.  The patient was evaluated in the preop area, surgical site confirmed, surgical site marked. Chart update completed. The patient was then taken to the operating room for general anesthesia.  In the supine position with an arthroscopic leg holder for the right knee in a well-leg padded holder for the left knee the right knee and lower extremity were prepped and draped sterilely  Timeout procedure was executed  Standard medial lateral portals were established the scope was placed into the joint into the suprapatellar pouch diagnostic arthroscopy was performed. The knee was evaluated systematically and circumferentially on 4 separate occasions and a brief debridement of the area where the loose body was thought to be in the anteromedial joint was performed with no evidence of loose body. The knee was irrigated with a suction device in the joint and no loose body was found  A probe was used to evaluate the chondral surfaces of the patella and found to be firm and intact with no softening  The knee was irrigated injected with Marcaine and the scope was taken out of the joint. We then injected the pes bursa.  Portals were closed with 3-0 nylon  Sterile dressing was applied  Cryo/Cuff was placed and activated  Patient was extubated and taken to the recovery room in stable condition she will be discharged home

## 2012-01-06 NOTE — Interval H&P Note (Signed)
History and Physical Interval Note:  01/06/2012 7:23 AM  [name]  has presented today for surgery, with the diagnosis of LOOSE BODY, BURSITIS RIGHT KNEE  The various methods of treatment have been discussed with the patient and family. After consideration of risks, benefits and other options for treatment, the patient has consented to  Procedure(s) (LRB) with comments: ARTHROSCOPY KNEE (Right) KNEE INJECTION (Right) as a surgical intervention .  The patient's history has been reviewed, patient examined, no change in status, stable for surgery.  I have reviewed the patient's chart and labs.  Questions were answered to the patient's satisfaction.    RIGHT KNEE [place]

## 2012-01-06 NOTE — Anesthesia Postprocedure Evaluation (Signed)
  Anesthesia Post-op Note  Patient: [name]  Procedure(s) Performed: Procedure(s) (LRB) with comments: ARTHROSCOPY KNEE (Right) KNEE INJECTION (Right)  Patient Location: PACU  Anesthesia Type: General  Level of Consciousness: awake, alert , oriented and patient cooperative  Airway and Oxygen Therapy: Patient Spontanous Breathing and Patient connected to face mask oxygen  Post-op Pain: mild  Post-op Assessment: Post-op Vital signs reviewed, Patient's Cardiovascular Status Stable, Respiratory Function Stable, Patent Airway and No signs of Nausea or vomiting  Post-op Vital Signs: Reviewed and stable  Complications: No apparent anesthesia complications

## 2012-01-06 NOTE — Anesthesia Procedure Notes (Signed)
Procedure Name: LMA Insertion Date/Time: 01/06/2012 7:36 AM Performed by: Carolyne Littles,  L Pre-anesthesia Checklist: Patient identified, Timeout performed, Emergency Drugs available, Suction available and Patient being monitored Patient Re-evaluated:Patient Re-evaluated prior to inductionOxygen Delivery Method: Circle system utilized Preoxygenation: Pre-oxygenation with 100% oxygen Intubation Type: IV induction Ventilation: Mask ventilation without difficulty LMA Size: 4.0 Placement Confirmation: positive ETCO2 and breath sounds checked- equal and bilateral Tube secured with: Tape Dental Injury: Teeth and Oropharynx as per pre-operative assessment

## 2012-01-06 NOTE — Anesthesia Preprocedure Evaluation (Signed)
Anesthesia Evaluation  Patient identified by MRN, date of birth, ID band Patient awake    Reviewed: Allergy & Precautions, H&P , NPO status , Patient's Chart, lab work & pertinent test results  History of Anesthesia Complications (+) PONV  Airway Mallampati: II TM Distance: >3 FB     Dental  (+) Teeth Intact   Pulmonary neg pulmonary ROS,  breath sounds clear to auscultation        Cardiovascular negative cardio ROS  Rhythm:Regular Rate:Normal     Neuro/Psych    GI/Hepatic negative GI ROS,   Endo/Other    Renal/GU Renal disease     Musculoskeletal   Abdominal   Peds  Hematology   Anesthesia Other Findings   Reproductive/Obstetrics                           Anesthesia Physical Anesthesia Plan  ASA: I  Anesthesia Plan: General   Post-op Pain Management:    Induction: Intravenous  Airway Management Planned: LMA  Additional Equipment:   Intra-op Plan:   Post-operative Plan: Extubation in OR  Informed Consent: I have reviewed the patients History and Physical, chart, labs and discussed the procedure including the risks, benefits and alternatives for the proposed anesthesia with the patient or authorized representative who has indicated his/her understanding and acceptance.     Plan Discussed with:   Anesthesia Plan Comments:         Anesthesia Quick Evaluation

## 2012-01-06 NOTE — Transfer of Care (Signed)
  Anesthesia Post-op Note  Patient: [name]  Procedure(s) Performed: Procedure(s) (LRB) with comments: ARTHROSCOPY KNEE (Right) KNEE INJECTION (Right)  Patient Location: PACU  Anesthesia Type: General  Level of Consciousness: awake, alert , oriented and patient cooperative  Airway and Oxygen Therapy: Patient Spontanous Breathing and Patient connected to face mask oxygen  Post-op Pain: mild  Post-op Assessment: Post-op Vital signs reviewed, Patient's Cardiovascular Status Stable, Respiratory Function Stable, Patent Airway and No signs of Nausea or vomiting  Post-op Vital Signs: Reviewed and stable  Complications: No apparent anesthesia complications  

## 2012-01-06 NOTE — Preoperative (Signed)
Beta Blockers   Reason not to administer Beta Blockers:Not Applicable 

## 2012-01-09 ENCOUNTER — Ambulatory Visit (INDEPENDENT_AMBULATORY_CARE_PROVIDER_SITE_OTHER): Payer: BC Managed Care – PPO | Admitting: Orthopedic Surgery

## 2012-01-09 ENCOUNTER — Encounter (HOSPITAL_COMMUNITY): Payer: Self-pay | Admitting: Orthopedic Surgery

## 2012-01-09 ENCOUNTER — Encounter: Payer: Self-pay | Admitting: Orthopedic Surgery

## 2012-01-09 VITALS — Ht 60.0 in | Wt 126.0 lb

## 2012-01-09 DIAGNOSIS — Z9889 Other specified postprocedural states: Secondary | ICD-10-CM

## 2012-01-09 NOTE — OR Nursing (Signed)
Dr. Kathie Rhodes. Harrison injected Depo-Medrol 40 mg intraop, unable to document in OR record

## 2012-01-09 NOTE — Patient Instructions (Signed)
Start PT   Continue  knee exercises   Continue ice

## 2012-01-09 NOTE — Progress Notes (Signed)
Patient ID: [name], female   DOB: 20-Jan-1966, 46 y.o.   MRN: [id number] Chief Complaint  Patient presents with  . Follow-up    Post op #1, right knee. SARK, injection for bursitis. November 15    Date of surgery 01/06/2012  Preop diagnosis loose body right knee, secondary diagnosis pes anserine bursitis  Postop diagnosis knee pain, pes anserine bursitis  Procedure arthroscopy right knee  Procedure #2 injection right peds anserine bursa  Surgeon Romeo Apple  Assistants none  Anesthesia Gen.  Operative findings normal knee surfaces normal ligaments no loose body. There is no chondral fissuring in the patella chondral surfaces were firm.  Sutures were removed   Skin was irritated laterally   Flexion up to 90 degrees

## 2012-01-24 ENCOUNTER — Ambulatory Visit
Admission: RE | Admit: 2012-01-24 | Discharge: 2012-01-24 | Disposition: A | Discharge status: Home / Self Care | Payer: BC Managed Care – PPO | Source: Ambulatory Visit | Attending: Family Medicine | Admitting: Family Medicine

## 2012-01-24 DIAGNOSIS — Z1231 Encounter for screening mammogram for malignant neoplasm of breast: Secondary | ICD-10-CM

## 2012-01-30 ENCOUNTER — Ambulatory Visit (INDEPENDENT_AMBULATORY_CARE_PROVIDER_SITE_OTHER): Payer: BC Managed Care – PPO | Admitting: Orthopedic Surgery

## 2012-01-30 ENCOUNTER — Encounter: Payer: Self-pay | Admitting: Orthopedic Surgery

## 2012-01-30 VITALS — Ht 60.0 in | Wt 126.0 lb

## 2012-01-30 DIAGNOSIS — M2340 Loose body in knee, unspecified knee: Secondary | ICD-10-CM

## 2012-01-30 DIAGNOSIS — M23329 Other meniscus derangements, posterior horn of medial meniscus, unspecified knee: Secondary | ICD-10-CM

## 2012-01-30 DIAGNOSIS — M7050 Other bursitis of knee, unspecified knee: Secondary | ICD-10-CM | POA: Insufficient documentation

## 2012-01-30 DIAGNOSIS — IMO0002 Reserved for concepts with insufficient information to code with codable children: Secondary | ICD-10-CM

## 2012-01-30 NOTE — Progress Notes (Signed)
Patient ID: [name], female   DOB: 1965-04-13, 46 y.o.   MRN: [id number] Chief Complaint  Patient presents with  . Follow-up    3 week recheck on right knee, DOS 01-06-12, SARK.    1. Pes anserinus bursitis  Ambulatory referral to Physical Therapy  2. Body, loose, knee    3. Medial meniscus, posterior horn derangement      The patient comes in with resolution of the anterior knee pain and persistent pain over the medial pain is anserine bursa,  She's regained most of her range of motion. I tried to show her disc from the surgery, and it did not prepped, but I was able to show her the pictures of her knee, which turned out to be normal. Fixation of a stress reaction to the patella from bone contusion, as well as a meniscoid lesion in the anterior soft tissues and bursitis, which developed after the injury.  Recommend iontophoresis and ultrasound, electrical stimulation, therapy, and a follow up in 3 weeks.

## 2012-02-20 ENCOUNTER — Ambulatory Visit (INDEPENDENT_AMBULATORY_CARE_PROVIDER_SITE_OTHER): Payer: BC Managed Care – PPO | Admitting: Orthopedic Surgery

## 2012-02-20 ENCOUNTER — Encounter: Payer: Self-pay | Admitting: Orthopedic Surgery

## 2012-02-20 VITALS — BP 80/58 | Ht 60.0 in | Wt 126.0 lb

## 2012-02-20 DIAGNOSIS — M7050 Other bursitis of knee, unspecified knee: Secondary | ICD-10-CM

## 2012-02-20 DIAGNOSIS — IMO0002 Reserved for concepts with insufficient information to code with codable children: Secondary | ICD-10-CM

## 2012-02-20 NOTE — Patient Instructions (Addendum)
You have received a steroid shot. 15% of patients experience increased pain at the injection site with in the next 24 hours. This is best treated with ice and tylenol extra strength 2 tabs every 8 hours. If you are still having pain please call the office.   Apply Aspercreme 3 x a day   Apply ice 3 x a day   Continue therapy

## 2012-02-20 NOTE — Progress Notes (Signed)
Patient ID: [name], female   DOB: [birth year], 46 y.o.   MRN: [id number] Chief Complaint  Patient presents with  . Follow-up    recheck post op right SARK 01/06/12    The patient is still complaining of medial knee pain over the pes anserine bursa. She's got some iontophoresis treatment. Still having significant pain.  We did inject her at the time of surgery. Does not seem to have responded well.  Recommend continue physical therapy at asper cream.  I reinjected the area of the more proximal injection.  Her physical exam was consistent with pain and tenderness and swelling over the pes anserine bursa and tendon with minimal to no medial joint line tenderness.  Impression pes anserine bursitis status post arthroscopy, RIGHT knee  Knee  Injection Procedure Note  Pre-operative Diagnosis: RIGHT knee bursitis  Post-operative Diagnosis: same  Indications: pain  Anesthesia: ethyl chloride   Procedure Details   Verbal consent was obtained for the procedure. Time out was completed.The bursa  was prepped with alcohol, followed by  Ethyl chloride spray and A 25 gauge needle was inserted into the pes bursa via lateral approach; 4ml 1% lidocaine and 1 ml of depomedrol  was then injected into the joint . The needle was removed and the area cleansed and dressed.  Complications:  None; patient tolerated the procedure well.

## 2012-03-15 ENCOUNTER — Ambulatory Visit (INDEPENDENT_AMBULATORY_CARE_PROVIDER_SITE_OTHER): Payer: 59 | Admitting: Orthopedic Surgery

## 2012-03-15 ENCOUNTER — Encounter: Payer: Self-pay | Admitting: Orthopedic Surgery

## 2012-03-15 VITALS — BP 86/54 | Ht 60.0 in | Wt 126.0 lb

## 2012-03-15 DIAGNOSIS — IMO0002 Reserved for concepts with insufficient information to code with codable children: Secondary | ICD-10-CM

## 2012-03-15 DIAGNOSIS — M7050 Other bursitis of knee, unspecified knee: Secondary | ICD-10-CM

## 2012-03-15 DIAGNOSIS — Z9889 Other specified postprocedural states: Secondary | ICD-10-CM | POA: Insufficient documentation

## 2012-03-15 DIAGNOSIS — M23329 Other meniscus derangements, posterior horn of medial meniscus, unspecified knee: Secondary | ICD-10-CM

## 2012-03-15 DIAGNOSIS — M2340 Loose body in knee, unspecified knee: Secondary | ICD-10-CM

## 2012-03-15 NOTE — Progress Notes (Signed)
Patient ID: [name], female   DOB: 1965-07-16, 47 y.o.   MRN: [id number] Chief Complaint  Patient presents with  . Follow-up    postop sark Jan 05 2012, & inj pes bursitis last visit    1. Pes anserinus bursitis   2. Medial meniscus, posterior horn derangement   3. Body, loose, knee   4. S/P right knee arthroscopy     BP 86/54  Ht 5' (1.524 m)  Wt 126 lb (57.153 kg)  BMI 24.61 kg/m2   Much better. She can tell her knee is feeling better. She responded very well to the injection.   VITAL SIGNS  WELL DEVELOPED WELL NOURISHED   NEUROVASCULAR EXAM INTACT   No tenderness over the bursa of the knee range of motion is full     excellent quadriceps muscle tone   MEDICAL DECISION MAKING  Return as needed may need injection in the future for bursitis continue Voltaren gel and/or Aspercreme with ice as needed

## 2012-03-15 NOTE — Patient Instructions (Signed)
aspercreme or voltaren gel and ice as needed

## 2012-04-07 ENCOUNTER — Other Ambulatory Visit: Payer: Self-pay

## 2012-12-27 ENCOUNTER — Other Ambulatory Visit: Payer: Self-pay

## 2012-12-27 DIAGNOSIS — Z1231 Encounter for screening mammogram for malignant neoplasm of breast: Secondary | ICD-10-CM

## 2013-02-08 ENCOUNTER — Ambulatory Visit
Admission: RE | Admit: 2013-02-08 | Discharge: 2013-02-08 | Disposition: A | Discharge status: Home / Self Care | Payer: 59 | Source: Ambulatory Visit

## 2013-02-08 DIAGNOSIS — Z1231 Encounter for screening mammogram for malignant neoplasm of breast: Secondary | ICD-10-CM

## 2013-11-05 ENCOUNTER — Other Ambulatory Visit: Payer: Self-pay

## 2013-11-05 DIAGNOSIS — Z1231 Encounter for screening mammogram for malignant neoplasm of breast: Secondary | ICD-10-CM

## 2013-11-27 ENCOUNTER — Other Ambulatory Visit: Payer: Self-pay | Admitting: Obstetrics and Gynecology

## 2013-11-27 ENCOUNTER — Other Ambulatory Visit (HOSPITAL_COMMUNITY)
Admission: RE | Admit: 2013-11-27 | Discharge: 2013-11-27 | Disposition: A | Discharge status: Home / Self Care | Payer: 59 | Source: Ambulatory Visit | Attending: Obstetrics and Gynecology | Admitting: Obstetrics and Gynecology

## 2013-11-27 DIAGNOSIS — Z01419 Encounter for gynecological examination (general) (routine) without abnormal findings: Secondary | ICD-10-CM | POA: Diagnosis not present

## 2013-11-28 LAB — CYTOLOGY - PAP

## 2013-12-06 ENCOUNTER — Other Ambulatory Visit: Payer: Self-pay

## 2013-12-17 ENCOUNTER — Ambulatory Visit (INDEPENDENT_AMBULATORY_CARE_PROVIDER_SITE_OTHER): Payer: 59

## 2013-12-17 ENCOUNTER — Encounter: Payer: Self-pay | Admitting: Podiatry

## 2013-12-17 ENCOUNTER — Ambulatory Visit (INDEPENDENT_AMBULATORY_CARE_PROVIDER_SITE_OTHER): Payer: 59 | Admitting: Podiatry

## 2013-12-17 VITALS — Ht 60.0 in | Wt 138.0 lb

## 2013-12-17 DIAGNOSIS — M898X9 Other specified disorders of bone, unspecified site: Secondary | ICD-10-CM

## 2013-12-17 NOTE — Progress Notes (Signed)
   Subjective:    Patient ID: [name], female    DOB: 05/16/1965, 48 y.o.   MRN: [id number]  HPI Comments: "I have pain in this toe"  Patient c/o aching 1st toe left for several months. She says that it is sore under the toenail. She hasn't had an injury. There is slight redness. She tried cutting the toenail but no help.  Toe Pain       Review of Systems  All other systems reviewed and are negative.      Objective:   Physical Exam: She presents today with a chief complaint of a painful hallux left. I have reviewed her past medical history medications allergies surgery social history and review of systems. Pulses are strongly palpable bilaterally. Neurologic sensorium is intact percent once the monofilament. Deep tendon reflexes are intact bilateral strength is 5 over 5 dorsiflexion plantar flexors and inverters and everters all of his musculature is intact. Orthopedic evaluation and x-rays all joints distal to the angle full range of motion without crepitation. Cutaneous evaluation and stress of well-hydrated cutis no erythema edema saline drainage or odor that she does have some tenderness on palpation of the medial and distal medial aspect of the hallux left along the nail ridge and nail plate. Radiographic evaluation does demonstrate a small spur 2 to the medial aspect of the distal tuft of the hallux. Possibly resulting in her symptomatology.        Assessment & Plan:  Assessment: Hallux exostosis left.  Plan: Discussed etiology pathology conservative versus surgical therapies. At this point we discussed surgical intervention consisting of an exostectomy to the hallux distal phalanx medial and distal. I answered all the questions regarding this procedure to the best of my ability in layman's terms she understood it was amenable to it and signed all 3 pages of the form. We discussed the possible postop complications which may include but are not limited to postop pain  bleeding swelling infection loss of nail also digit loss of limb loss of life overcorrection under correction. I will follow-up with her soon.

## 2013-12-17 NOTE — Patient Instructions (Signed)
Pre-Operative Instructions  Congratulations, you have decided to take an important step to improving your quality of life.  You can be assured that the doctors of [facility] will be with you every step of the way.  1. Plan to be at the surgery center/hospital at least 1 (one) hour prior to your scheduled time unless otherwise directed by the surgical center/hospital staff.  You must have a responsible adult accompany you, remain during the surgery and drive you home.  Make sure you have directions to the surgical center/hospital and know how to get there on time. 2. For hospital based surgery you will need to obtain a history and physical form from your family physician within 1 month prior to the date of surgery- we will give you a form for you primary physician.  3. We make every effort to accommodate the date you request for surgery.  There are however, times where surgery dates or times have to be moved.  We will contact you as soon as possible if a change in schedule is required.   4. No Aspirin/Ibuprofen for one week before surgery.  If you are on aspirin, any non-steroidal anti-inflammatory medications (Mobic, Aleve, Ibuprofen) you should stop taking it 7 days prior to your surgery.  You make take Tylenol  For pain prior to surgery.  5. Medications- If you are taking daily heart and blood pressure medications, seizure, reflux, allergy, asthma, anxiety, pain or diabetes medications, make sure the surgery center/hospital is aware before the day of surgery so they may notify you which medications to take or avoid the day of surgery. 6. No food or drink after midnight the night before surgery unless directed otherwise by surgical center/hospital staff. 7. No alcoholic beverages 24 hours prior to surgery.  No smoking 24 hours prior to or 24 hours after surgery. 8. Wear loose pants or shorts- loose enough to fit over bandages, boots, and casts. 9. No slip on shoes, sneakers are best. 10. Bring  your boot with you to the surgery center/hospital.  Also bring crutches or a walker if your physician has prescribed it for you.  If you do not have this equipment, it will be provided for you after surgery. 11. If you have not been contracted by the surgery center/hospital by the day before your surgery, call to confirm the date and time of your surgery. 12. Leave-time from work may vary depending on the type of surgery you have.  Appropriate arrangements should be made prior to surgery with your employer. 13. Prescriptions will be provided immediately following surgery by your doctor.  Have these filled as soon as possible after surgery and take the medication as directed. 14. Remove nail polish on the operative foot. 15. Wash the night before surgery.  The night before surgery wash the foot and leg well with the antibacterial soap provided and water paying special attention to beneath the toenails and in between the toes.  Rinse thoroughly with water and dry well with a towel.  Perform this wash unless told not to do so by your physician.  Enclosed: 1 Ice pack (please put in freezer the night before surgery)   1 Hibiclens skin cleaner   Pre-op Instructions  If you have any questions regarding the instructions, do not hesitate to call our office.  [place]: [address] [place], [place] [address] [phone]  [place]: [address] [place], [place] [address] [phone]  [place]: [address]  [place], [place] [address] [phone]  Dr. Richard   Tuchman DPM, Dr. Norman Regal DPM Dr. Richard Sikora DPM, Dr. M. Todd  DPM, Dr. Kathryn Egerton DPM 

## 2013-12-22 HISTORY — PX: OTHER SURGICAL HISTORY: SHX169

## 2014-01-07 ENCOUNTER — Other Ambulatory Visit: Payer: Self-pay | Admitting: Podiatry

## 2014-01-07 MED ORDER — CLINDAMYCIN HCL 150 MG PO CAPS: 150.0000 mg | ORAL_CAPSULE | Freq: Three times a day (TID) | ORAL | Status: DC

## 2014-01-07 MED ORDER — PROMETHAZINE HCL 25 MG PO TABS: 25.0000 mg | ORAL_TABLET | Freq: Three times a day (TID) | ORAL | Status: DC | PRN

## 2014-01-07 MED ORDER — OXYCODONE-ACETAMINOPHEN 10-325 MG PO TABS: ORAL_TABLET | ORAL | Status: DC

## 2014-01-10 ENCOUNTER — Encounter: Payer: Self-pay | Admitting: Podiatry

## 2014-01-10 DIAGNOSIS — M25775 Osteophyte, left foot: Secondary | ICD-10-CM

## 2014-01-13 ENCOUNTER — Telehealth: Payer: Self-pay

## 2014-01-13 NOTE — Telephone Encounter (Signed)
Spoke with patient regarding post operative status. She stated that she was doing well and managing pain effectively. Advised to watch for s/s of infection and keep dressing dry and foot elevated and iced

## 2014-01-14 ENCOUNTER — Encounter: Payer: 59 | Admitting: Podiatry

## 2014-01-14 NOTE — Progress Notes (Signed)
  Dr Al CorpusHyatt performed a exostectomy hallux left foot on 01/10/14

## 2014-01-15 ENCOUNTER — Ambulatory Visit (INDEPENDENT_AMBULATORY_CARE_PROVIDER_SITE_OTHER): Payer: 59

## 2014-01-15 ENCOUNTER — Ambulatory Visit (INDEPENDENT_AMBULATORY_CARE_PROVIDER_SITE_OTHER): Payer: 59 | Admitting: Podiatry

## 2014-01-15 ENCOUNTER — Other Ambulatory Visit: Payer: Self-pay | Admitting: Podiatry

## 2014-01-15 ENCOUNTER — Encounter: Payer: Self-pay | Admitting: Podiatry

## 2014-01-15 ENCOUNTER — Ambulatory Visit: Payer: 59 | Admitting: Podiatrist

## 2014-01-15 ENCOUNTER — Encounter: Payer: 59 | Admitting: Podiatrist

## 2014-01-15 DIAGNOSIS — M898X9 Other specified disorders of bone, unspecified site: Secondary | ICD-10-CM

## 2014-01-15 DIAGNOSIS — Z9889 Other specified postprocedural states: Secondary | ICD-10-CM

## 2014-01-20 NOTE — Progress Notes (Signed)
Patient ID: [name], female   DOB: 03/13/1965, 48 y.o.   MRN: [id number]  Subjective: 48 year old female presents the office today postop visit #1 status post left hallux exostectomy performed on the lumbar 20th 2015. She states that she is continued on antibiotics without any difficulty. She states that she has minimal pain although the day before the appointment she did drive approximately 9 hours with her feet in a dependent position and she has had some increasing pain today compared to prior. She states that mostly she is taking ibuprofen for the pain although she did take Percocet intermittently as needed. She denies any systemic complaints as fevers, chills, nausea, vomiting. No acute changes since last appointment.  Objective: AAO x3, NAD DP/PT pulses palpable bilaterally, CRT less than 3 seconds Protective sensation intact with Simms Weinstein monofilament, vibratory sensation intact, Achilles tendon reflex intact Incision on the left hallux is well coapted with sutures intact without any evidence of dehiscence. There is minimal erythema directly around the incision however there is no ascending cellulitis, increase in warmth or other clinical signs of infection. No areas of fluctuance or crepitus. Mild edema to the hallux. There is mild tenderness to the over the surgical site. No pain with calf compression, swelling, warmth, erythema.  Assessment: 48 year old female status post left hallux exostectomy.  Plan: -Treatment options discussed including alternatives, risks, complications. -X-rays were obtained and reviewed with the patient. -At this time, continue with surgical shoe. Continue with ice and elevation. Surgical site was redressed. Keep dressing clean, dry, intact. -Follow-up in 7-10 days for likely suture removal. In the meantime, call the office with any questions, concerns, change in symptoms.

## 2014-01-21 ENCOUNTER — Encounter: Payer: 59 | Admitting: Podiatry

## 2014-01-28 ENCOUNTER — Ambulatory Visit (INDEPENDENT_AMBULATORY_CARE_PROVIDER_SITE_OTHER): Payer: 59 | Admitting: Podiatry

## 2014-01-28 VITALS — BP 111/65 | HR 81 | Temp 97.7°F | Resp 16

## 2014-01-28 DIAGNOSIS — Z9889 Other specified postprocedural states: Secondary | ICD-10-CM

## 2014-01-28 NOTE — Progress Notes (Signed)
She presents today a little more than 2 weeks status post exostectomy hallux left. She denies fever chills nausea vomiting muscle aches and pains.  Objective: Hallux left demonstrates margins well coapted sutures are intact there is no erythema and is a large drainage or odor. Margins were well coapted even though the sutures were removed.  Assessment: Well-healing surgical toe hallux left.  Plan: Sutures were removed today Steri-Strips were placed dry sterile compressive dressing was applied she will start soaking it IS also a water daily and apply a dressing. I will follow up with her in 2 weeks

## 2014-02-06 ENCOUNTER — Ambulatory Visit (INDEPENDENT_AMBULATORY_CARE_PROVIDER_SITE_OTHER): Payer: 59

## 2014-02-06 ENCOUNTER — Ambulatory Visit (INDEPENDENT_AMBULATORY_CARE_PROVIDER_SITE_OTHER): Payer: 59 | Admitting: Podiatry

## 2014-02-06 VITALS — BP 137/76 | HR 81 | Resp 16

## 2014-02-06 DIAGNOSIS — T8140XA Infection following a procedure, unspecified, initial encounter: Secondary | ICD-10-CM

## 2014-02-06 DIAGNOSIS — T814XXA Infection following a procedure, initial encounter: Secondary | ICD-10-CM

## 2014-02-06 DIAGNOSIS — M898X9 Other specified disorders of bone, unspecified site: Secondary | ICD-10-CM

## 2014-02-06 MED ORDER — MUPIROCIN 2 % EX OINT: TOPICAL_OINTMENT | CUTANEOUS | Status: DC

## 2014-02-06 MED ORDER — CLINDAMYCIN HCL 150 MG PO CAPS: 300.0000 mg | ORAL_CAPSULE | Freq: Three times a day (TID) | ORAL | Status: DC

## 2014-02-06 NOTE — Progress Notes (Signed)
She presents today as a follow-up for a exostectomy hallux left. She states that I noticed that it has been severely painful and sore right back here as she points to the proximal nail fold. She's been soaking in Betadine and water and Epsom salts and water.  Objective: Vital signs are stable she is alert and oriented 3 toe does appear to be read along the tibial border but no purulence is noted.  Assessment: Paronychia status post postop infection hallux left.  Plan: Started her on clindamycin 300 mg 3 times a day, soaking daily and Epsom salts and water and applying Bactroban ointment daily. I will follow-up with her in 1 week.

## 2014-02-10 ENCOUNTER — Inpatient Hospital Stay: Admission: RE | Admit: 2014-02-10 | Payer: 59 | Source: Ambulatory Visit

## 2014-02-10 ENCOUNTER — Telehealth: Payer: Self-pay | Admitting: *Deleted

## 2014-02-10 NOTE — Telephone Encounter (Addendum)
Pt states she has a question about her medication.  Pt states she would like to discuss the dosage of her medication Clindamycin 150mg  two capsules tid, is upsetting her GI tract, but will discuss with Dr. Al CorpusHyatt today.

## 2014-02-11 ENCOUNTER — Other Ambulatory Visit: Payer: Self-pay | Admitting: Podiatry

## 2014-02-11 ENCOUNTER — Encounter: Payer: Self-pay | Admitting: Podiatry

## 2014-02-11 ENCOUNTER — Ambulatory Visit (INDEPENDENT_AMBULATORY_CARE_PROVIDER_SITE_OTHER): Payer: 59 | Admitting: Podiatry

## 2014-02-11 VITALS — BP 118/76 | HR 76 | Resp 16

## 2014-02-11 DIAGNOSIS — Z9889 Other specified postprocedural states: Secondary | ICD-10-CM

## 2014-02-11 DIAGNOSIS — T814XXA Infection following a procedure, initial encounter: Secondary | ICD-10-CM

## 2014-02-11 DIAGNOSIS — T8140XA Infection following a procedure, unspecified, initial encounter: Secondary | ICD-10-CM

## 2014-02-11 MED ORDER — LEVOFLOXACIN 750 MG PO TABS: 750.0000 mg | ORAL_TABLET | Freq: Every day | ORAL | Status: DC

## 2014-02-11 MED ORDER — OXYCODONE-ACETAMINOPHEN 10-325 MG PO TABS: ORAL_TABLET | ORAL | Status: DC

## 2014-02-12 NOTE — Progress Notes (Signed)
Miranda BradfordKimberly presents today for follow-up of her postop infection hallux left. She was unable to take the clindamycin 300 mg 3 times a day because of gastric issues. She continues to soak daily. She denies fever chills nausea vomiting muscle aches and pains with exception of the stomach pain from the antibiotic.  Objective: Vital signs are stable she is alert and oriented 3. Pulses are palpable to the left foot. Hallux has improved considerably in its edema and erythema. There is some erythema left I am unable to see any active infection or. Lengths.  Assessment: Postop infection hallux left.  Plan: Discontinue clindamycin start 750 mg of Levaquin. I will follow up with her in 2 weeks. She will continue soaking twice daily. Should this not look much improved next visit that we may have to numb the toe up and removed that tibial nail fold.

## 2014-02-13 ENCOUNTER — Ambulatory Visit: Payer: 59

## 2014-02-13 ENCOUNTER — Ambulatory Visit
Admission: RE | Admit: 2014-02-13 | Discharge: 2014-02-13 | Disposition: A | Discharge status: Home / Self Care | Payer: 59 | Source: Ambulatory Visit

## 2014-02-13 DIAGNOSIS — Z1231 Encounter for screening mammogram for malignant neoplasm of breast: Secondary | ICD-10-CM

## 2014-02-19 ENCOUNTER — Encounter: Payer: Self-pay | Admitting: Podiatry

## 2014-02-19 ENCOUNTER — Ambulatory Visit (INDEPENDENT_AMBULATORY_CARE_PROVIDER_SITE_OTHER): Payer: 59 | Admitting: Podiatry

## 2014-02-19 ENCOUNTER — Telehealth: Payer: Self-pay

## 2014-02-19 VITALS — BP 106/80 | HR 80 | Resp 18

## 2014-02-19 DIAGNOSIS — Z9889 Other specified postprocedural states: Secondary | ICD-10-CM

## 2014-02-19 DIAGNOSIS — T814XXA Infection following a procedure, initial encounter: Secondary | ICD-10-CM

## 2014-02-19 DIAGNOSIS — T8140XA Infection following a procedure, unspecified, initial encounter: Secondary | ICD-10-CM

## 2014-02-19 MED ORDER — LEVOFLOXACIN 750 MG PO TABS: 750.0000 mg | ORAL_TABLET | Freq: Every day | ORAL | Status: DC

## 2014-02-19 NOTE — Telephone Encounter (Signed)
Spoke with patient regarding foot infection. She stated that her foot didn't seem to improve, she states she thinks it is still infected and is currently draining, her last dose of antibiotic runs out tomorrow. I advised patient to come in and have one of our doctors look at her foot, she agreed to come in today to be seen.

## 2014-02-19 NOTE — Patient Instructions (Signed)
Continue soaking as directed and continue antibiotics.

## 2014-02-23 NOTE — Progress Notes (Signed)
Patient ID: [name], female   DOB: [birth year], 49 y.o.   MRN: [id number]  Subjective: 49 year old female presents the office today with complaints of infection and the left hallux around the surgical site. The patient hadn't exostectomy performed on 01/10/2014 to remove a bone spur on the left hallux. She states that she last saw Dr. Al Corpus on the summer 22nd and was prescribed Levaquin and discontinue the clindamycin. She states that she is out of Levaquin and is inquiring about another prescription. She states that the infection she believes has not worsened however has not improved much either. She denies any systemic complaints as fevers, chills, nausea, vomiting. No other complaints at this time in no acute changes since last appointment.  Objective: AAO 3, NAD DP/PT pulse palpable, CRT less than 3 seconds Protective sensation intact with Simms Weinstein monofilament Scar overlying the medial/distal aspect of left hallux is well coapted. There is mild erythema and edema overlying the left hallux. There is mild tenderness directly overlying the medial aspect of the nail border. At this time, there is no drainage or purulence expressed from around the nail. There is no ascending cellulitis. No areas of fluctuance or crepitus. No open lesions or pre-ulcerative lesions No pain with calf compression, swelling, warmth, erythema.  Assessment: 49 year old female left hallux postop infection  Plan: -Treatment options were discussed the patient including alternatives, risks, complications. -At this time she is completed and a 9 day course of Levaquin. She does not report much improvement in symptoms since starting the antibiotic, but also states it has not worsened. At this time, I discussed with her possible partial nail avulsion the left hallux. Currently, there is no purulence or drainage expressed. We'll continue antibiotics over the next couple of days. If at her next appointment with Dr.  Al Corpus, which is scheduled for Monday, symptoms have not improved discussed with her likely need for partial nail avulsion. -Follow-up as scheduled. In the meantime, encouraged to call the office with any questions, concerns, change in symptoms.

## 2014-02-25 ENCOUNTER — Ambulatory Visit: Payer: 59 | Admitting: Podiatry

## 2014-02-27 ENCOUNTER — Ambulatory Visit: Payer: Self-pay | Admitting: Podiatry

## 2014-03-06 ENCOUNTER — Ambulatory Visit (INDEPENDENT_AMBULATORY_CARE_PROVIDER_SITE_OTHER): Payer: 59

## 2014-03-06 ENCOUNTER — Ambulatory Visit (INDEPENDENT_AMBULATORY_CARE_PROVIDER_SITE_OTHER): Payer: 59 | Admitting: Podiatry

## 2014-03-06 VITALS — BP 110/60 | HR 80 | Resp 16

## 2014-03-06 DIAGNOSIS — T814XXA Infection following a procedure, initial encounter: Secondary | ICD-10-CM

## 2014-03-06 DIAGNOSIS — M898X9 Other specified disorders of bone, unspecified site: Secondary | ICD-10-CM

## 2014-03-06 DIAGNOSIS — T8140XA Infection following a procedure, unspecified, initial encounter: Secondary | ICD-10-CM

## 2014-03-07 NOTE — Progress Notes (Signed)
She presents today for postop visit dated 01/10/2014 exostectomy hallux left with a history of complication of infection to the surgical site. She was unable to complete the Levaquin secondary to body aches and pains. She states the infection appears to be resolving.  Objective: Vital signs are stable she is alert and oriented 3 today. No erythema cellulitis drainage or odor. The tibial margin of the hallux nail is mildly elevated and demonstrates what appears to be a sharp margin of nail as it grows from the root. The surgical site itself has gone on to heal uneventfully and there are no signs of infections remaining. Radiographically there are no signs of periostitis indicative of osteoarthritis and there are no erosions.  Assessment: Postsurgical hallux left once infected appears to be healing nicely.  Plan: I trimmed the margin of the nail today exposing the nail bed which will now be more easily dry by soaking in Epsom salts and water. I encouraged the patient not to apply Neosporin rather Vaseline. She will covered in the daily Lopid and night and I will follow-up with her in a couple of weeks.

## 2014-03-18 ENCOUNTER — Ambulatory Visit: Payer: 59 | Admitting: Podiatry

## 2014-10-23 ENCOUNTER — Encounter (HOSPITAL_BASED_OUTPATIENT_CLINIC_OR_DEPARTMENT_OTHER): Payer: Self-pay | Admitting: *Deleted

## 2014-10-23 NOTE — Progress Notes (Signed)
NPO AFTER MN WITH EXCEPTION CLEAR LIQUIDS UNTIL 0800 (NO CREAM/ MILK PRODUCTS). ARRIVE AT 1245. NEEDS HG. 

## 2014-10-30 ENCOUNTER — Ambulatory Visit (HOSPITAL_BASED_OUTPATIENT_CLINIC_OR_DEPARTMENT_OTHER): Payer: 59 | Admitting: Anesthesiology

## 2014-10-30 ENCOUNTER — Encounter (HOSPITAL_BASED_OUTPATIENT_CLINIC_OR_DEPARTMENT_OTHER)
Admission: RE | Disposition: A | Discharge status: Home / Self Care | Payer: Self-pay | Source: Ambulatory Visit | Attending: Orthopedic Surgery

## 2014-10-30 ENCOUNTER — Encounter (HOSPITAL_BASED_OUTPATIENT_CLINIC_OR_DEPARTMENT_OTHER): Payer: Self-pay | Admitting: *Deleted

## 2014-10-30 ENCOUNTER — Ambulatory Visit (HOSPITAL_BASED_OUTPATIENT_CLINIC_OR_DEPARTMENT_OTHER)
Admission: RE | Admit: 2014-10-30 | Discharge: 2014-10-30 | Disposition: A | Discharge status: Home / Self Care | Payer: 59 | Source: Ambulatory Visit | Attending: Orthopedic Surgery | Admitting: Orthopedic Surgery

## 2014-10-30 DIAGNOSIS — L948 Other specified localized connective tissue disorders: Secondary | ICD-10-CM | POA: Diagnosis not present

## 2014-10-30 DIAGNOSIS — Z87891 Personal history of nicotine dependence: Secondary | ICD-10-CM | POA: Diagnosis not present

## 2014-10-30 DIAGNOSIS — Z882 Allergy status to sulfonamides status: Secondary | ICD-10-CM | POA: Insufficient documentation

## 2014-10-30 DIAGNOSIS — Z881 Allergy status to other antibiotic agents status: Secondary | ICD-10-CM | POA: Diagnosis not present

## 2014-10-30 DIAGNOSIS — R2231 Localized swelling, mass and lump, right upper limb: Secondary | ICD-10-CM

## 2014-10-30 DIAGNOSIS — M1990 Unspecified osteoarthritis, unspecified site: Secondary | ICD-10-CM | POA: Diagnosis not present

## 2014-10-30 DIAGNOSIS — Z880 Allergy status to penicillin: Secondary | ICD-10-CM | POA: Diagnosis not present

## 2014-10-30 HISTORY — DX: Presence of spectacles and contact lenses: Z97.3

## 2014-10-30 HISTORY — DX: Personal history of urinary calculi: Z87.442

## 2014-10-30 HISTORY — DX: Localized swelling, mass and lump, right upper limb: R22.31

## 2014-10-30 HISTORY — PX: MASS EXCISION: SHX2000

## 2014-10-30 LAB — POCT HEMOGLOBIN-HEMACUE: Hemoglobin: 14.2 g/dL (ref 12.0–15.0)

## 2014-10-30 SURGERY — EXCISION MASS
Anesthesia: Monitor Anesthesia Care | Site: Thumb | Laterality: Right

## 2014-10-30 MED ORDER — HYDROCODONE-ACETAMINOPHEN 5-300 MG PO TABS: 1.0000 | ORAL_TABLET | Freq: Four times a day (QID) | ORAL | Status: DC | PRN

## 2014-10-30 MED ORDER — MIDAZOLAM HCL 5 MG/5ML IJ SOLN
INTRAMUSCULAR | Status: DC | PRN
  Administered 2014-10-30: 2 mg via INTRAVENOUS

## 2014-10-30 MED ORDER — ONDANSETRON HCL 4 MG/2ML IJ SOLN
INTRAMUSCULAR | Status: DC | PRN
  Administered 2014-10-30: 4 mg via INTRAVENOUS

## 2014-10-30 MED ORDER — CHLORHEXIDINE GLUCONATE 4 % EX LIQD
60.0000 mL | Freq: Once | CUTANEOUS | Status: DC
  Filled 2014-10-30: qty 60

## 2014-10-30 MED ORDER — LACTATED RINGERS IV SOLN
INTRAVENOUS | Status: DC
Start: 2014-10-30 — End: 2014-10-30
  Administered 2014-10-30: 13:00:00 via INTRAVENOUS
  Filled 2014-10-30: qty 1000

## 2014-10-30 MED ORDER — PROPOFOL 500 MG/50ML IV EMUL
INTRAVENOUS | Status: DC | PRN
  Administered 2014-10-30: 75 ug/kg/min via INTRAVENOUS

## 2014-10-30 MED ORDER — PROMETHAZINE HCL 25 MG/ML IJ SOLN
6.2500 mg | INTRAMUSCULAR | Status: DC | PRN
  Filled 2014-10-30: qty 1

## 2014-10-30 MED ORDER — FENTANYL CITRATE (PF) 100 MCG/2ML IJ SOLN
INTRAMUSCULAR | Status: AC
  Filled 2014-10-30: qty 2

## 2014-10-30 MED ORDER — MEPERIDINE HCL 25 MG/ML IJ SOLN
6.2500 mg | INTRAMUSCULAR | Status: DC | PRN
Start: 2014-10-30 — End: 2014-10-30
  Filled 2014-10-30: qty 1

## 2014-10-30 MED ORDER — DEXAMETHASONE SODIUM PHOSPHATE 4 MG/ML IJ SOLN
INTRAMUSCULAR | Status: DC | PRN
  Administered 2014-10-30: 5 mg via INTRAVENOUS

## 2014-10-30 MED ORDER — MIDAZOLAM HCL 2 MG/2ML IJ SOLN
INTRAMUSCULAR | Status: AC
  Filled 2014-10-30: qty 2

## 2014-10-30 MED ORDER — OXYCODONE HCL 5 MG PO TABS
5.0000 mg | ORAL_TABLET | Freq: Once | ORAL | Status: DC | PRN
  Filled 2014-10-30: qty 1

## 2014-10-30 MED ORDER — CLINDAMYCIN PHOSPHATE 900 MG/50ML IV SOLN
900.0000 mg | INTRAVENOUS | Status: AC
  Administered 2014-10-30: 900 mg via INTRAVENOUS
  Filled 2014-10-30: qty 50

## 2014-10-30 MED ORDER — HYDROMORPHONE HCL 1 MG/ML IJ SOLN
0.2500 mg | INTRAMUSCULAR | Status: DC | PRN
  Administered 2014-10-30: 0.25 mg via INTRAVENOUS
  Filled 2014-10-30: qty 1

## 2014-10-30 MED ORDER — FENTANYL CITRATE (PF) 100 MCG/2ML IJ SOLN
INTRAMUSCULAR | Status: DC | PRN
  Administered 2014-10-30 (×2): 50 ug via INTRAVENOUS

## 2014-10-30 MED ORDER — OXYCODONE HCL 5 MG/5ML PO SOLN
5.0000 mg | Freq: Once | ORAL | Status: DC | PRN
  Filled 2014-10-30: qty 5

## 2014-10-30 MED ORDER — CLINDAMYCIN PHOSPHATE 900 MG/50ML IV SOLN
INTRAVENOUS | Status: AC
  Filled 2014-10-30: qty 50

## 2014-10-30 MED ORDER — LIDOCAINE HCL (CARDIAC) 20 MG/ML IV SOLN
INTRAVENOUS | Status: DC | PRN
  Administered 2014-10-30: 50 mg via INTRAVENOUS

## 2014-10-30 MED ORDER — BUPIVACAINE HCL (PF) 0.25 % IJ SOLN
INTRAMUSCULAR | Status: DC | PRN
  Administered 2014-10-30: 8 mL

## 2014-10-30 MED ORDER — HYDROMORPHONE HCL 1 MG/ML IJ SOLN
INTRAMUSCULAR | Status: AC
  Filled 2014-10-30: qty 1

## 2014-10-30 SURGICAL SUPPLY — 60 items
BANDAGE CO FLEX L/F 1IN X 5YD (GAUZE/BANDAGES/DRESSINGS) ×1 IMPLANT
BANDAGE ELASTIC 3 VELCRO ST LF (GAUZE/BANDAGES/DRESSINGS) IMPLANT
BANDAGE ELASTIC 4 VELCRO ST LF (GAUZE/BANDAGES/DRESSINGS) IMPLANT
BLADE SURG 15 STRL LF DISP TIS (BLADE) ×1 IMPLANT
BLADE SURG 15 STRL SS (BLADE) ×2
BNDG CMPR 9X4 STRL LF SNTH (GAUZE/BANDAGES/DRESSINGS) ×1
BNDG COHESIVE 3X5 TAN STRL LF (GAUZE/BANDAGES/DRESSINGS) IMPLANT
BNDG CONFORM 2 STRL LF (GAUZE/BANDAGES/DRESSINGS) ×1 IMPLANT
BNDG ESMARK 4X9 LF (GAUZE/BANDAGES/DRESSINGS) ×2 IMPLANT
BNDG GAUZE ELAST 4 BULKY (GAUZE/BANDAGES/DRESSINGS) ×1 IMPLANT
CLOTH BEACON ORANGE TIMEOUT ST (SAFETY) ×1 IMPLANT
CORDS BIPOLAR (ELECTRODE) ×1 IMPLANT
COVER BACK TABLE 60X90IN (DRAPES) ×2 IMPLANT
COVER MAYO STAND STRL (DRAPES) ×1 IMPLANT
CUFF TOURN SGL QUICK 18 (TOURNIQUET CUFF) ×1 IMPLANT
DRAIN PENROSE 18X1/4 LTX STRL (WOUND CARE) IMPLANT
DRAPE EXTREMITY T 121X128X90 (DRAPE) ×2 IMPLANT
DRAPE LG THREE QUARTER DISP (DRAPES) ×2 IMPLANT
DRAPE SURG 17X23 STRL (DRAPES) ×2 IMPLANT
DRSG EMULSION OIL 3X3 NADH (GAUZE/BANDAGES/DRESSINGS) IMPLANT
ELECT NDL TIP 2.8 STRL (NEEDLE) IMPLANT
ELECT NEEDLE TIP 2.8 STRL (NEEDLE) IMPLANT
ELECT REM PT RETURN 9FT ADLT (ELECTROSURGICAL) ×2
ELECTRODE REM PT RTRN 9FT ADLT (ELECTROSURGICAL) ×1 IMPLANT
GAUZE SPONGE 4X4 16PLY XRAY LF (GAUZE/BANDAGES/DRESSINGS) IMPLANT
GAUZE XEROFORM 1X8 LF (GAUZE/BANDAGES/DRESSINGS) ×1 IMPLANT
GLOVE BIO SURGEON STRL SZ8 (GLOVE) ×2 IMPLANT
GLOVE BIOGEL PI IND STRL 8.5 (GLOVE) ×1 IMPLANT
GLOVE BIOGEL PI INDICATOR 8.5 (GLOVE) ×1
GOWN STRL REUS W/TWL LRG LVL3 (GOWN DISPOSABLE) ×2 IMPLANT
GOWN STRL REUS W/TWL XL LVL3 (GOWN DISPOSABLE) ×2 IMPLANT
LOOP VESSEL MAXI BLUE (MISCELLANEOUS) IMPLANT
MANIFOLD NEPTUNE II (INSTRUMENTS) IMPLANT
NDL HYPO 25X1 1.5 SAFETY (NEEDLE) IMPLANT
NEEDLE HYPO 25X1 1.5 SAFETY (NEEDLE) ×2 IMPLANT
NS IRRIG 500ML POUR BTL (IV SOLUTION) ×2 IMPLANT
PACK BASIN DAY SURGERY FS (CUSTOM PROCEDURE TRAY) ×2 IMPLANT
PAD CAST 3X4 CTTN HI CHSV (CAST SUPPLIES) ×1 IMPLANT
PADDING CAST ABS 4INX4YD NS (CAST SUPPLIES)
PADDING CAST ABS COTTON 4X4 ST (CAST SUPPLIES) ×1 IMPLANT
PADDING CAST COTTON 3X4 STRL (CAST SUPPLIES)
PENCIL BUTTON HOLSTER BLD 10FT (ELECTRODE) IMPLANT
SPLINT PLASTER CAST XFAST 3X15 (CAST SUPPLIES) IMPLANT
SPLINT PLASTER XTRA FASTSET 3X (CAST SUPPLIES)
SPONGE GAUZE 4X4 12PLY STER LF (GAUZE/BANDAGES/DRESSINGS) ×1 IMPLANT
STOCKINETTE 4X48 STRL (DRAPES) ×2 IMPLANT
SUT ETHILON 4 0 P 3 18 (SUTURE) IMPLANT
SUT ETHILON 5 0 P 3 18 (SUTURE)
SUT NYLON ETHILON 5-0 P-3 1X18 (SUTURE) IMPLANT
SUT PROLENE 4 0 RB 1 (SUTURE) ×2
SUT PROLENE 4-0 RB1 .5 CRCL 36 (SUTURE) IMPLANT
SUT PROLENE 5 0 P 3 (SUTURE) ×2 IMPLANT
SUT VIC AB 4-0 P-3 18XBRD (SUTURE) IMPLANT
SUT VIC AB 4-0 P3 18 (SUTURE) ×2
SYR BULB 3OZ (MISCELLANEOUS) ×2 IMPLANT
SYR CONTROL 10ML LL (SYRINGE) ×1 IMPLANT
TOWEL OR 17X24 6PK STRL BLUE (TOWEL DISPOSABLE) ×4 IMPLANT
TRAY DSU PREP LF (CUSTOM PROCEDURE TRAY) ×2 IMPLANT
UNDERPAD 30X30 INCONTINENT (UNDERPADS AND DIAPERS) ×2 IMPLANT
WATER STERILE IRR 500ML POUR (IV SOLUTION) ×2 IMPLANT

## 2014-10-30 NOTE — Anesthesia Postprocedure Evaluation (Signed)
  Anesthesia Post-op Note  Patient: [name]  Procedure(s) Performed: Procedure(s) (LRB): RIGHT THUMB DEEP MASS EXCISION  (Right)  Patient Location: PACU  Anesthesia Type: MAC  Level of Consciousness: awake and alert   Airway and Oxygen Therapy: Patient Spontanous Breathing  Post-op Pain: mild  Post-op Assessment: Post-op Vital signs reviewed, Patient's Cardiovascular Status Stable, Respiratory Function Stable, Patent Airway and No signs of Nausea or vomiting  Last Vitals:  Filed Vitals:   10/30/14 1545  BP: 108/68  Pulse: 73  Temp:   Resp: 12    Post-op Vital Signs: stable   Complications: No apparent anesthesia complications

## 2014-10-30 NOTE — Anesthesia Procedure Notes (Signed)
Procedure Name: MAC Performed by: ,  G Pre-anesthesia Checklist: Patient identified, Timeout performed, Emergency Drugs available, Suction available and Patient being monitored Patient Re-evaluated:Patient Re-evaluated prior to inductionOxygen Delivery Method: Nasal cannula Intubation Type: IV induction Placement Confirmation: positive ETCO2     

## 2014-10-30 NOTE — H&P (Signed)
[name] is an 49 y.o. female.   Chief Complaint: right thumb pain HPI: pt with soft tissue mass over right thumb Pt here for surgery to remove mass  Past Medical History  Diagnosis Date  . PONV (postoperative nausea and vomiting)   . History of kidney stones   . Mass of finger of right hand     THUMB  . Wears glasses     Past Surgical History  Procedure Laterality Date  . Knee arthroscopy  01/06/2012    Procedure: ARTHROSCOPY KNEE;  Surgeon: Vickki Hearing, MD;  Location: [facility];  Service: Orthopedics;  Laterality: Right;  . Injection knee  01/06/2012    Procedure: KNEE INJECTION;  Surgeon: Vickki Hearing, MD;  Location: [facility];  Service: Orthopedics;  Laterality: Right;  . Shoulder arthroscopy with rotator cuff repair and subacromial decompression Right 01-08-2009    w/ Debridement labral tear/  DCR/  Acromioplasty  . Excision axillary mass Right 07-20-1998    BENIGN  . Laparoscopy/  laser ablation endometriosis/  bilateral tubal ligation with filshie clips  01-12-2004  . Left great toe excision bone spurs  Nov  2015  . Laparoscopic cholecystectomy  2007  . Cervical biopsy  w/ loop electrode excision  age 55  . Extracorporeal shock wave lithotripsy  2008    Family History  Problem Relation Age of Onset  . Cancer     Social History:  reports that she quit smoking about 21 years ago. Her smoking use included Cigarettes. She has a 1.2 pack-year smoking history. She has never used smokeless tobacco. She reports that she drinks alcohol. She reports that she does not use illicit drugs.  Allergies:  Allergies  Allergen Reactions  . Levaquin [Levofloxacin] Other (See Comments)    Severe body aches  . Penicillins Other (See Comments)    Unknown Childhood reaction    . Sulfa Antibiotics Swelling and Rash    Medications Prior to Admission  Medication Sig Dispense Refill  . ibuprofen (ADVIL,MOTRIN) 200 MG tablet Take 800 mg by mouth every 6 (six) hours as  needed.      Results for orders placed or performed during the hospital encounter of 10/30/14 (from the past 48 hour(s))  Hemoglobin-hemacue, POC     Status: None   Collection Time: 10/30/14  1:17 PM  Result Value Ref Range   Hemoglobin 14.2 12.0 - 15.0 g/dL   No results found.  ROS NO RECENT ILLNESSES OR HOSPITALIZATIONS  Blood pressure 103/72, pulse 74, temperature 98.2 F (36.8 C), temperature source Oral, resp. rate 12, height 5' (1.524 m), weight 62.596 kg (138 lb), last menstrual period 10/17/2014, SpO2 99 %. Physical Exam  General Appearance:  Alert, cooperative, no distress, appears stated age  Head:  Normocephalic, without obvious abnormality, atraumatic  Eyes:  Pupils equal, conjunctiva/corneas clear,         Throat: Lips, mucosa, and tongue normal; teeth and gums normal  Neck: No visible masses     Lungs:   respirations unlabored  Chest Wall:  No tenderness or deformity  Heart:  Regular rate and rhythm,  Abdomen:   Soft, non-tender,         Extremities: RIGHT THUMB: TTP OVER RADIAL ASPECT OF THUMB GOOD THUMB MOBILITY  GOOD DIGITAL MOBILITY GOOD WRIST MOTION AND FOREARM ROTATION  Pulses: 2+ and symmetric  Skin: Skin color, texture, turgor normal, no rashes or lesions     Neurologic: Normal   Assessment/Plan RIGHT THUMB MASS   RIGHT  THUMB MASS EXCISION  R/B/A DISCUSSED WITH PT IN OFFICE.  PT VOICED UNDERSTANDING OF PLAN CONSENT SIGNED DAY OF SURGERY PT SEEN AND EXAMINED PRIOR TO OPERATIVE PROCEDURE/DAY OF SURGERY SITE MARKED. QUESTIONS ANSWERED WILL GO HOME FOLLOWING SURGERY WE ARE PLANNING SURGERY FOR YOUR UPPER EXTREMITY. THE RISKS AND BENEFITS OF SURGERY INCLUDE BUT NOT LIMITED TO BLEEDING INFECTION, DAMAGE TO NEARBY NERVES ARTERIES TENDONS, FAILURE OF SURGERY TO ACCOMPLISH ITS INTENDED GOALS, PERSISTENT SYMPTOMS AND NEED FOR FURTHER SURGICAL INTERVENTION. WITH THIS IN MIND WE WILL PROCEED. I HAVE DISCUSSED WITH THE PATIENT THE PRE AND POSTOPERATIVE  REGIMEN AND THE DOS AND DON'TS. PT VOICED UNDERSTANDING AND INFORMED CONSENT SIGNED.  Sharma Covert 10/30/2014, 2:28 PM

## 2014-10-30 NOTE — Discharge Instructions (Signed)
KEEP BANDAGE CLEAN AND DRY CALL OFFICE FOR F/U APPT [phone] in 8 days KEEP HAND ELEVATED ABOVE HEART OK TO APPLY ICE TO OPERATIVE AREA CONTACT OFFICE IF ANY WORSENING PAIN OR CONCERNS.        HAND SURGERY    HOME CARE INSTRUCTIONS    The following instructions have been prepared to help you care for yourself upon your return home today.  Wound Care:  Keep your hand elevated above the level of your heart. Do not allow it to dangle by your side. Keep the dressing dry and do not remove it unless your doctor advises you to do so. He will usually change it at the time of you post-op visit. Moving your fingers is advised to stimulate circulation but will depend on the site of your surgery. Of course, if you have a splint applied your doctor will advise you about movement.  Activity:  Do not drive or operate machinery today. Rest today and then you may return to your normal activity and work as indicated by your physician.  Diet: Drink liquids today or eat a light diet. You may resume a regular diet tomorrow.  General expectations: Pain for two or three days. Fingers may become slightly swollen.   Unexpected Observations- Call your doctor if any of these occur: Severe pain not relieved by pain medication. Elevated temperature. Dressing soaked with blood. Inability to move fingers. Change in color and/or sensation White or bluish color to fingers.      Post Anesthesia Home Care Instructions  Activity: Get plenty of rest for the remainder of the day. A responsible adult should stay with you for 24 hours following the procedure.  For the next 24 hours, DO NOT: -Drive a car -Advertising copywriter -Drink alcoholic beverages -Take any medication unless instructed by your physician -Make any legal decisions or sign important papers.  Meals: Start with liquid foods such as gelatin or soup. Progress to regular foods as tolerated. Avoid greasy, spicy, heavy foods. If nausea and/or  vomiting occur, drink only clear liquids until the nausea and/or vomiting subsides. Call your physician if vomiting continues.  Special Instructions/Symptoms: Your throat may feel dry or sore from the anesthesia or the breathing tube placed in your throat during surgery. If this causes discomfort, gargle with warm salt water. The discomfort should disappear within 24 hours.  If you had a scopolamine patch placed behind your ear for the management of post- operative nausea and/or vomiting:  1. The medication in the patch is effective for 72 hours, after which it should be removed.  Wrap patch in a tissue and discard in the trash. Wash hands thoroughly with soap and water. 2. You may remove the patch earlier than 72 hours if you experience unpleasant side effects which may include dry mouth, dizziness or visual disturbances. 3. Avoid touching the patch. Wash your hands with soap and water after contact with the patch.

## 2014-10-30 NOTE — Anesthesia Preprocedure Evaluation (Signed)
Anesthesia Evaluation  Patient identified by MRN, date of birth, ID band Patient awake    Reviewed: Allergy & Precautions, H&P , NPO status , Patient's Chart, lab work & pertinent test results  History of Anesthesia Complications (+) PONV and history of anesthetic complications  Airway Mallampati: II  TM Distance: >3 FB     Dental  (+) Teeth Intact   Pulmonary neg pulmonary ROS, former smoker,    breath sounds clear to auscultation       Cardiovascular negative cardio ROS   Rhythm:Regular Rate:Normal     Neuro/Psych    GI/Hepatic negative GI ROS,   Endo/Other    Renal/GU Renal disease     Musculoskeletal  (+) Arthritis ,   Abdominal   Peds  Hematology   Anesthesia Other Findings   Reproductive/Obstetrics                             Anesthesia Physical  Anesthesia Plan  ASA: I  Anesthesia Plan: MAC   Post-op Pain Management:    Induction: Intravenous  Airway Management Planned:   Additional Equipment:   Intra-op Plan:   Post-operative Plan:   Informed Consent: I have reviewed the patients History and Physical, chart, labs and discussed the procedure including the risks, benefits and alternatives for the proposed anesthesia with the patient or authorized representative who has indicated his/her understanding and acceptance.   Dental advisory given  Plan Discussed with: CRNA  Anesthesia Plan Comments:         Anesthesia Quick Evaluation

## 2014-10-30 NOTE — Brief Op Note (Signed)
10/30/2014  2:31 PM  PATIENT:  [name]  49 y.o. female  PRE-OPERATIVE DIAGNOSIS:  right thumb deep mass  POST-OPERATIVE DIAGNOSIS:  * No post-op diagnosis entered *  PROCEDURE:  Procedure(s): RIGHT THUMB DEEP MASS EXCISION  (Right)  SURGEON:  Surgeon(s) and Role:    * Bradly Bienenstock, MD - Primary  PHYSICIAN ASSISTANT:   ASSISTANTS: none   ANESTHESIA:   MAC  EBL:     BLOOD ADMINISTERED:none  DRAINS: none   LOCAL MEDICATIONS USED:  MARCAINE     SPECIMEN:  No Specimen  DISPOSITION OF SPECIMEN:  PATHOLOGY  COUNTS:  YES  TOURNIQUET:  * No tourniquets in log *  DICTATION: .Other Dictation: Dictation Number [id number]  PLAN OF CARE: Discharge to home after PACU  PATIENT DISPOSITION:  PACU - hemodynamically stable.   Delay start of Pharmacological VTE agent (>24hrs) due to surgical blood loss or risk of bleeding: not applicable

## 2014-10-30 NOTE — Transfer of Care (Signed)
  Last Vitals:  Filed Vitals:   10/30/14 1245  BP: 103/72  Pulse: 74  Temp: 36.8 C  Resp: 12    Immediate Anesthesia Transfer of Care Note  Patient: [name]  Procedure(s) Performed: Procedure(s) (LRB): RIGHT THUMB DEEP MASS EXCISION  (Right)  Patient Location: PACU  Anesthesia Type:MAC  Level of Consciousness: awake, alert  and oriented  Airway & Oxygen Therapy: Patient Spontanous Breathing and Patient connected to nasal cannula oxygen  Post-op Assessment: Report given to PACU RN and Post -op Vital signs reviewed and stable  Post vital signs: Reviewed and stable  Complications: No apparent anesthesia complications

## 2014-10-31 ENCOUNTER — Encounter (HOSPITAL_BASED_OUTPATIENT_CLINIC_OR_DEPARTMENT_OTHER): Payer: Self-pay | Admitting: Orthopedic Surgery

## 2014-10-31 NOTE — Op Note (Signed)
NAMEARUSHI, PARTRIDGE             ACCOUNT NO.:  [id number]  MEDICAL RECORD NO.:  [id number]  LOCATION:                               FACILITY:  [facility]  PHYSICIAN:  Sharma Covert IV, M.D.DATE OF BIRTH:  Apr 02, 1965  DATE OF PROCEDURE:  10/30/2014 DATE OF DISCHARGE:  10/30/2014                              OPERATIVE REPORT   PREOPERATIVE DIAGNOSIS:  Right thumb deep mass.  POSTOPERATIVE DIAGNOSIS:  Right thumb deep mass.  ATTENDING PHYSICIAN:  Sharma Covert, M.D. who was scrubbed and present for the entire procedure.  ASSISTANT SURGEON:  None.  ANESTHESIA:  1% Xylocaine and 0.25% Marcaine local block with IV sedation.  SURGICAL PROCEDURES: 1. Right thumb deep mass excision less than 1.5 cm, subfascial. 2. Right thumb radial digital nerve neurolysis and decompression.  SURGICAL INDICATIONS:  Mrs. Jamaica is a right-hand-dominant female with persistent pain over the volar radial aspect of the right thumb.  The patient elected to undergo the above procedure.  Risks, benefits, and alternatives were discussed in detail with the patient.  Signed informed consent was obtained.  Risks include, but not limited to bleeding; infection; damage to nearby nerves, arteries, or tendons; loss motion of the wrist and digits; incomplete relief of symptoms and need for further surgical intervention.  DESCRIPTION OF PROCEDURE:  The patient was properly identified in the preoperative holding area and marked with a permanent marker made on the right thumb to indicate the correct operative site.  The patient was then brought back to the operating room and placed supine on the anesthesia room table where the IV sedation was administered.  Local anesthetic was administered.  The right upper extremity was then prepped and draped in normal sterile fashion.  Time-out was called.  The correct site was identified and the procedure was begun.  Attention was then turned to the right thumb.  A longitudinal  incision was made directly in the midaxial line over the thumb radial aspect.  At the IP joint, dissection was carried down through the skin and subcutaneous tissue. The radial digital nerve was then carefully identified and neurolysis was then carried out of the nerve from the proximal phalanx all the way up toward the distal tip.  Careful dissection of the nerve was then done.  Following this, a small less than 1.5 cm mass was then identified just along the volar radial aspect of the radial neurovascular bundle. The neurovascular bundle was then carefully retracted and the mass was then excised.  The wound was then thoroughly irrigated.  After thorough wound irrigation, tourniquet was deflated, hemostasis was obtained.  The skin was then closed using simple 4-0 Prolene sutures.  Adaptic dressing and sterile compressive bandage applied.  The patient tolerated the procedure well, returned to the recovery room in good condition.  Intraoperative specimens of the volar mass to Pathology.  POSTPROCEDURAL PLAN:  The patient will be discharged to home, seen back in the office in approximately 8 days for wound check, suture removal, go over to pathology and then gradual use and activity.     Madelynn Done, M.D.     FWO/MEDQ  D:  10/30/2014  T:  10/31/2014  Job:  [id number] 

## 2014-12-09 ENCOUNTER — Other Ambulatory Visit (HOSPITAL_COMMUNITY)
Admission: RE | Admit: 2014-12-09 | Discharge: 2014-12-09 | Disposition: A | Discharge status: Home / Self Care | Payer: 59 | Source: Ambulatory Visit | Attending: Obstetrics and Gynecology | Admitting: Obstetrics and Gynecology

## 2014-12-09 ENCOUNTER — Other Ambulatory Visit: Payer: Self-pay | Admitting: Obstetrics and Gynecology

## 2014-12-09 DIAGNOSIS — Z1151 Encounter for screening for human papillomavirus (HPV): Secondary | ICD-10-CM | POA: Insufficient documentation

## 2014-12-09 DIAGNOSIS — Z01419 Encounter for gynecological examination (general) (routine) without abnormal findings: Secondary | ICD-10-CM | POA: Insufficient documentation

## 2014-12-10 LAB — CYTOLOGY - PAP

## 2015-01-22 ENCOUNTER — Other Ambulatory Visit: Payer: Self-pay

## 2015-01-22 DIAGNOSIS — Z1231 Encounter for screening mammogram for malignant neoplasm of breast: Secondary | ICD-10-CM

## 2015-02-24 ENCOUNTER — Ambulatory Visit
Admission: RE | Admit: 2015-02-24 | Discharge: 2015-02-24 | Disposition: A | Discharge status: Home / Self Care | Payer: 59 | Source: Ambulatory Visit

## 2015-02-24 DIAGNOSIS — Z1231 Encounter for screening mammogram for malignant neoplasm of breast: Secondary | ICD-10-CM

## 2015-03-02 ENCOUNTER — Other Ambulatory Visit: Payer: Self-pay | Admitting: Physician Assistant

## 2015-03-02 DIAGNOSIS — K921 Melena: Secondary | ICD-10-CM

## 2015-03-02 DIAGNOSIS — R197 Diarrhea, unspecified: Secondary | ICD-10-CM

## 2015-03-02 DIAGNOSIS — R109 Unspecified abdominal pain: Secondary | ICD-10-CM

## 2015-03-05 ENCOUNTER — Other Ambulatory Visit: Payer: 59

## 2016-01-28 ENCOUNTER — Other Ambulatory Visit: Payer: Self-pay | Admitting: Family Medicine

## 2016-01-28 DIAGNOSIS — Z1231 Encounter for screening mammogram for malignant neoplasm of breast: Secondary | ICD-10-CM

## 2016-03-02 ENCOUNTER — Ambulatory Visit
Admission: RE | Admit: 2016-03-02 | Discharge: 2016-03-02 | Disposition: A | Discharge status: Home / Self Care | Payer: 59 | Source: Ambulatory Visit | Attending: Family Medicine | Admitting: Family Medicine

## 2016-03-02 DIAGNOSIS — Z1231 Encounter for screening mammogram for malignant neoplasm of breast: Secondary | ICD-10-CM

## 2017-02-09 ENCOUNTER — Other Ambulatory Visit: Payer: Self-pay | Admitting: Family Medicine

## 2017-02-09 DIAGNOSIS — Z1231 Encounter for screening mammogram for malignant neoplasm of breast: Secondary | ICD-10-CM

## 2017-03-09 ENCOUNTER — Ambulatory Visit
Admission: RE | Admit: 2017-03-09 | Discharge: 2017-03-09 | Disposition: A | Discharge status: Home / Self Care | Payer: 59 | Source: Ambulatory Visit | Attending: Family Medicine | Admitting: Family Medicine

## 2017-03-09 DIAGNOSIS — Z1231 Encounter for screening mammogram for malignant neoplasm of breast: Secondary | ICD-10-CM

## 2017-12-19 ENCOUNTER — Other Ambulatory Visit: Payer: Self-pay | Admitting: Obstetrics and Gynecology

## 2017-12-19 ENCOUNTER — Other Ambulatory Visit (HOSPITAL_COMMUNITY)
Admission: RE | Admit: 2017-12-19 | Discharge: 2017-12-19 | Disposition: A | Discharge status: Home / Self Care | Payer: 59 | Source: Ambulatory Visit | Attending: Obstetrics and Gynecology | Admitting: Obstetrics and Gynecology

## 2017-12-19 DIAGNOSIS — Z01419 Encounter for gynecological examination (general) (routine) without abnormal findings: Secondary | ICD-10-CM | POA: Diagnosis present

## 2017-12-22 LAB — CYTOLOGY - PAP
Diagnosis: NEGATIVE
HPV 16/18/45 genotyping: POSITIVE — AB
HPV: DETECTED — AB

## 2018-02-05 ENCOUNTER — Other Ambulatory Visit: Payer: Self-pay | Admitting: Obstetrics and Gynecology

## 2018-02-09 ENCOUNTER — Other Ambulatory Visit: Payer: Self-pay | Admitting: Family Medicine

## 2018-02-09 DIAGNOSIS — Z1231 Encounter for screening mammogram for malignant neoplasm of breast: Secondary | ICD-10-CM

## 2018-03-19 ENCOUNTER — Ambulatory Visit
Admission: RE | Admit: 2018-03-19 | Discharge: 2018-03-19 | Disposition: A | Discharge status: Home / Self Care | Payer: 59 | Source: Ambulatory Visit | Attending: Family Medicine | Admitting: Family Medicine

## 2018-03-19 DIAGNOSIS — Z1231 Encounter for screening mammogram for malignant neoplasm of breast: Secondary | ICD-10-CM

## 2018-11-29 ENCOUNTER — Other Ambulatory Visit: Payer: Self-pay

## 2018-11-29 ENCOUNTER — Encounter: Payer: Self-pay | Admitting: Podiatry

## 2018-11-29 ENCOUNTER — Other Ambulatory Visit: Payer: Self-pay | Admitting: Podiatry

## 2018-11-29 ENCOUNTER — Ambulatory Visit (INDEPENDENT_AMBULATORY_CARE_PROVIDER_SITE_OTHER): Payer: 59 | Admitting: Podiatry

## 2018-11-29 ENCOUNTER — Ambulatory Visit (INDEPENDENT_AMBULATORY_CARE_PROVIDER_SITE_OTHER): Payer: 59

## 2018-11-29 DIAGNOSIS — G5791 Unspecified mononeuropathy of right lower limb: Secondary | ICD-10-CM | POA: Diagnosis not present

## 2018-11-29 DIAGNOSIS — M778 Other enthesopathies, not elsewhere classified: Secondary | ICD-10-CM

## 2018-11-29 DIAGNOSIS — M898X9 Other specified disorders of bone, unspecified site: Secondary | ICD-10-CM

## 2018-11-29 NOTE — Progress Notes (Signed)
Subjective:  Patient ID: [name], female    DOB: 08/30/1965,  MRN: [id number] HPI Chief Complaint  Patient presents with  . Foot Pain    Medial 1st MPJ right - aching, sharp x 3 months, tried CBD oil, aspercreme, Ibuprofen, no injury  . New Patient (Initial Visit)    Est pt 02/2014    53 y.o. female presents with the above complaint.   ROS: Denies fever chills nausea vomiting muscle aches pains calf pain back pain chest pain shortness of breath.  Past Medical History:  Diagnosis Date  . History of kidney stones   . Mass of finger of right hand    THUMB  . PONV (postoperative nausea and vomiting)   . Wears glasses    Past Surgical History:  Procedure Laterality Date  . BREAST EXCISIONAL BIOPSY Right    right axilla  . CERVICAL BIOPSY  W/ LOOP ELECTRODE EXCISION  age 89  . EXCISION AXILLARY MASS Right 07-20-1998   BENIGN  . EXTRACORPOREAL SHOCK WAVE LITHOTRIPSY  2008  . INJECTION KNEE  01/06/2012   Procedure: KNEE INJECTION;  Surgeon: Vickki Hearing, MD;  Location: [facility];  Service: Orthopedics;  Laterality: Right;  . KNEE ARTHROSCOPY  01/06/2012   Procedure: ARTHROSCOPY KNEE;  Surgeon: Vickki Hearing, MD;  Location: [facility];  Service: Orthopedics;  Laterality: Right;  . LAPAROSCOPIC CHOLECYSTECTOMY  2007  . LAPAROSCOPY/  LASER ABLATION ENDOMETRIOSIS/  BILATERAL TUBAL LIGATION WITH FILSHIE CLIPS  01-12-2004  . LEFT GREAT TOE EXCISION BONE SPURS  Nov  2015  . MASS EXCISION Right 10/30/2014   Procedure: RIGHT THUMB DEEP MASS EXCISION ;  Surgeon: Bradly Bienenstock, MD;  Location: [facility];  Service: Orthopedics;  Laterality: Right;  . SHOULDER ARTHROSCOPY WITH ROTATOR CUFF REPAIR AND SUBACROMIAL DECOMPRESSION Right 01-08-2009   w/ Debridement labral tear/  DCR/  Acromioplasty    Current Outpatient Medications:  .  Calcium Carbonate (CALCIUM 500 PO), Take by mouth., Disp: , Rfl:  .  cetirizine (ZYRTEC) 10 MG tablet, Take 10 mg by mouth daily., Disp: ,  Rfl:  .  Glucosamine 750 MG TABS, Take by mouth., Disp: , Rfl:  .  Multiple Vitamin (MULTIVITAMIN) capsule, Take 1 capsule by mouth daily., Disp: , Rfl:  .  escitalopram (LEXAPRO) 10 MG tablet, , Disp: , Rfl:  .  pantoprazole (PROTONIX) 40 MG tablet, , Disp: , Rfl:  .  valACYclovir (VALTREX) 500 MG tablet, , Disp: , Rfl:   Allergies  Allergen Reactions  . Levaquin [Levofloxacin] Other (See Comments)    Severe body aches  . Penicillins Other (See Comments)    Unknown Childhood reaction    . Sulfa Antibiotics Swelling and Rash   Review of Systems Objective:  There were no vitals filed for this visit.  General: Well developed, nourished, in no acute distress, alert and oriented x3   Dermatological: Skin is warm, dry and supple bilateral. Nails x 10 are well maintained; remaining integument appears unremarkable at this time. There are no open sores, no preulcerative lesions, no rash or signs of infection present.  Vascular: Dorsalis Pedis artery and Posterior Tibial artery pedal pulses are 2/4 bilateral with immedate capillary fill time. Pedal hair growth present. No varicosities and no lower extremity edema present bilateral.   Neruologic: Grossly intact via light touch bilateral. Vibratory intact via tuning fork bilateral. Protective threshold with Semmes Wienstein monofilament intact to all pedal sites bilateral. Patellar and Achilles deep tendon reflexes 2+ bilateral. No Babinski  or clonus noted bilateral.   Musculoskeletal: No gross boney pedal deformities bilateral. No pain, crepitus, or limitation noted with foot and ankle range of motion bilateral. Muscular strength 5/5 in all groups tested bilateral.  Pain on palpation to the dorsal medial cutaneous nerve at the level of the metatarsal phalangeal joint right.  Cannot rule out bursitis.  Gait: Unassisted, Nonantalgic.    Radiographs:  Radiographs taken today demonstrate really rectus foot with no acute abnormalities.  Mild  hypertrophic medial condyle to the head of the first metatarsal right foot.  Assessment & Plan:   Assessment: Bursitis neuritis first metatarsal phalangeal joint right foot  Plan: Discussed etiology pathology conservative or surgical therapies at this point I injected a small amount of dexamethasone 2 mg to the point of maximal tenderness.  She does not alleviate her symptoms she will notify us and we will consider either Kenalog or surgical treatment.      T. [place], [place]

## 2018-12-25 ENCOUNTER — Other Ambulatory Visit (HOSPITAL_COMMUNITY)
Admission: RE | Admit: 2018-12-25 | Discharge: 2018-12-25 | Disposition: A | Discharge status: Home / Self Care | Payer: 59 | Source: Ambulatory Visit | Attending: Obstetrics and Gynecology | Admitting: Obstetrics and Gynecology

## 2018-12-25 DIAGNOSIS — N870 Mild cervical dysplasia: Secondary | ICD-10-CM | POA: Diagnosis not present

## 2019-01-01 ENCOUNTER — Other Ambulatory Visit: Payer: Self-pay

## 2019-01-01 ENCOUNTER — Ambulatory Visit (INDEPENDENT_AMBULATORY_CARE_PROVIDER_SITE_OTHER): Payer: 59 | Admitting: Podiatry

## 2019-01-01 DIAGNOSIS — M79671 Pain in right foot: Secondary | ICD-10-CM | POA: Diagnosis not present

## 2019-01-01 DIAGNOSIS — G5791 Unspecified mononeuropathy of right lower limb: Secondary | ICD-10-CM

## 2019-01-02 ENCOUNTER — Telehealth: Payer: Self-pay | Admitting: *Deleted

## 2019-01-02 ENCOUNTER — Encounter: Payer: Self-pay | Admitting: Podiatry

## 2019-01-02 DIAGNOSIS — G5791 Unspecified mononeuropathy of right lower limb: Secondary | ICD-10-CM

## 2019-01-02 DIAGNOSIS — M79671 Pain in right foot: Secondary | ICD-10-CM

## 2019-01-02 NOTE — Telephone Encounter (Signed)
Patient called to say that [facility] is not in network but [facility]

## 2019-01-02 NOTE — Progress Notes (Signed)
Subjective:  Patient ID: [name], female    DOB: [birth year],  MRN: [id number]  Chief Complaint  Patient presents with  . Toe Pain    pt is here for a f/u on toe pain right, pt states that her big toe is not getting any better since the last time she was here    53 y.o. female presents with the above complaint.  Patient presents with the pain follow-up on the right big toe.  Patient states that it is not getting better injection has not helped she states that she wants an MRI to properly evaluate right partial medial MPJ pain neuritis versus bursitis.  She states that has alleviated nothing pain.  She denies any other acute complaints.  She has been ambulating in regular sneakers without assistive device.   Review of Systems: Negative except as noted in the HPI. Denies N/V/F/Ch.  Past Medical History:  Diagnosis Date  . History of kidney stones   . Mass of finger of right hand    THUMB  . PONV (postoperative nausea and vomiting)   . Wears glasses     Current Outpatient Medications:  .  Calcium Carbonate (CALCIUM 500 PO), Take by mouth., Disp: , Rfl:  .  cetirizine (ZYRTEC) 10 MG tablet, Take 10 mg by mouth daily., Disp: , Rfl:  .  escitalopram (LEXAPRO) 10 MG tablet, , Disp: , Rfl:  .  Glucosamine 750 MG TABS, Take by mouth., Disp: , Rfl:  .  hydrocortisone (ANUSOL-HC) 25 MG suppository, Place 25 mg rectally at bedtime., Disp: , Rfl:  .  metroNIDAZOLE (METROCREAM) 0.75 % cream, APPLY TO AFFECTED AREASATWICE DAILY., Disp: , Rfl:  .  Multiple Vitamin (MULTIVITAMIN) capsule, Take 1 capsule by mouth daily., Disp: , Rfl:  .  pantoprazole (PROTONIX) 20 MG tablet, Take 20 mg by mouth daily., Disp: , Rfl:  .  pantoprazole (PROTONIX) 40 MG tablet, , Disp: , Rfl:  .  valACYclovir (VALTREX) 1000 MG tablet, SMARTSIG:1 Tablet(s) By Mouth Every 12 Hours, Disp: , Rfl:  .  valACYclovir (VALTREX) 500 MG tablet, , Disp: , Rfl:   Social History   Tobacco Use  Smoking Status Former Smoker   . Packs/day: 0.12  . Years: 10.00  . Pack years: 1.20  . Types: Cigarettes  . Quit date: 10/22/1993  . Years since quitting: 25.2  Smokeless Tobacco Never Used    Allergies  Allergen Reactions  . Levaquin [Levofloxacin] Other (See Comments)    Severe body aches  . Penicillins Other (See Comments)    Unknown Childhood reaction    . Sulfa Antibiotics Swelling and Rash   Objective:  There were no vitals filed for this visit. There is no height or weight on file to calculate BMI. Constitutional Well developed. Well nourished.  Vascular Dorsalis pedis pulses palpable bilaterally. Posterior tibial pulses palpable bilaterally. Capillary refill normal to all digits.  No cyanosis or clubbing noted. Pedal hair growth normal.  Neurologic Normal speech. Oriented to person, place, and time. Epicritic sensation to light touch grossly present bilaterally.  Dermatologic Nails well groomed and normal in appearance. No open wounds. No skin lesions.  Orthopedic:  Pain on palpation to the right first MPJ.  No pain on range of motion of first MPJ active and passive.  Pain on palpation to the medial aspect of the first metatarsal.   Radiographs: None  Assessment:   1. Neuritis of foot, right    Plan:  Patient was evaluated and treated and all questions  answered.  Right first MPJ mellitus with capsulitis -It appears that given the patient's symptoms have not resolved with conservative therapy.  I believe the patient will benefit from obtaining a right foot MRI to evaluate the first MPJ.  This will help rule out capsulitis versus arthritis versus Neuritis. -Patient will obtain a right foot MRI and will come back and see Dr. Milinda Pointer for review and further treatment options.  No follow-ups on file.

## 2019-01-02 NOTE — Telephone Encounter (Signed)
Its done. Thanks.

## 2019-01-02 NOTE — Telephone Encounter (Signed)
Pt states Dr. Posey Pronto was going to order MRI and she wanted to know where it would be ordered. I informed A. Tamala Julian, CMA that I was waiting for orders and she would probably be referred to [facility]. Charmayne Sheer, CMA informed pt of my statement and that pt may want to check for in-network imaging centers.

## 2019-01-03 NOTE — Addendum Note (Signed)
Addended by: Harriett Sine D on: 01/03/2019 09:47 AM   Modules accepted: Orders

## 2019-01-03 NOTE — Telephone Encounter (Signed)
I spoke with [facility] and she states pre-certed MRI orders are to be faxed to [phone], NPI: [id number], Tax ID:  [id number].

## 2019-01-03 NOTE — Telephone Encounter (Signed)
Orders printed and placed in DR. Patel's CMA's To-Do folder.

## 2019-01-03 NOTE — Telephone Encounter (Signed)
I called pt and informed pt, [facility] was [facility] sister group and the MRIs were ordered through them.

## 2019-01-11 NOTE — Telephone Encounter (Addendum)
[place] states this member will need to be reviewed by [facility] group [phone], but could not transfer. I called [employer] group - Zenia Resides (female voice) states will need to be sent to another group area, transferred to Noe Gens states I would need to be transferred to a different team. I told Ria Comment this would be my 3rd transfer, she stated she would make sure there was a live person on the line. [place] states needs to be pre-certed by Carleene Overlie [phone]. [place]. 8:48am states Case: [id number] for MRI right foot without contrast [phone] requires Physician Review fax to [phone]. Faxed required clinical, orders and demographics to [facility].

## 2019-01-16 NOTE — Telephone Encounter (Signed)
[place] MRI [id number] RIGHT FOOT, CASE:  [id number], VALID 01/11/2019 - 01/092021. Faxed to [facility].

## 2019-01-22 NOTE — Telephone Encounter (Signed)
I informed pt of Dr. Stephenie Acres request to send copy of MRI disc to radiology specialist for more details for treatment planning, there would be a 10-14 day delay in final results and once received I would call with instructions. Pt asked if the process would be expedited if she brought the MRI disc copy to me. I told her I could certainly send ou the disc faster.

## 2019-01-22 NOTE — Telephone Encounter (Signed)
Dr. Milinda Pointer requested to send copy of MRI disc to be sent to [facility] for overread. I called [facility] to order copy of MRI disc printed from the MRI machine in [employer] format. Left message with Karilyn Cota requesting the MRI disc copy with required specifications.

## 2019-01-22 NOTE — Telephone Encounter (Signed)
Pt presented to office with copy of her MRI disc. I mailed to [facility] with a note to [facility]!

## 2019-01-23 ENCOUNTER — Encounter: Payer: Self-pay | Admitting: Podiatry

## 2019-02-04 ENCOUNTER — Encounter: Payer: Self-pay | Admitting: Podiatry

## 2019-02-06 ENCOUNTER — Other Ambulatory Visit: Payer: Self-pay | Admitting: Obstetrics and Gynecology

## 2019-03-07 ENCOUNTER — Other Ambulatory Visit: Payer: Self-pay | Admitting: Family Medicine

## 2019-03-07 DIAGNOSIS — Z1231 Encounter for screening mammogram for malignant neoplasm of breast: Secondary | ICD-10-CM

## 2019-03-28 ENCOUNTER — Telehealth: Payer: Self-pay | Admitting: *Deleted

## 2019-03-28 NOTE — Telephone Encounter (Signed)
-----   Message from Lanney Gins, [facility] sent at 03/28/2019  2:31 PM EST ----- [name], patient stated that she had an MRI back in December and they were to do a reread of the MRI and patient stated that no one has gotten back with her or if the results are even back yet. Thanks Misty Stanley

## 2019-04-04 ENCOUNTER — Other Ambulatory Visit: Payer: Self-pay

## 2019-04-04 ENCOUNTER — Ambulatory Visit (INDEPENDENT_AMBULATORY_CARE_PROVIDER_SITE_OTHER): Payer: 59 | Admitting: Podiatry

## 2019-04-04 DIAGNOSIS — M778 Other enthesopathies, not elsewhere classified: Secondary | ICD-10-CM

## 2019-04-04 NOTE — Progress Notes (Signed)
She presents today for follow-up of her pain to her first metatarsophalangeal joint of the right foot states that she has not been wearing shoes that irritate it and it has felt some better.  Objective: Vital signs are stable she alert oriented x3 MRI does demonstrate early osteoarthritic changes of the first metatarsophalangeal joint but does not demonstrate any changes of dorsal medial cutaneous nerve.  Assessment: Osteoarthritis and neuritis.  Plan: At this point to help rule out which is which we did an intra-articular injection with Kenalog and local anesthetic to help alleviate any inflammatory arthropathy.  Should this alleviate her symptoms we will obviously know his arthritis otherwise we may have to work on the medial dorsal cutaneous nerve.  This may require surgical intervention consisting of a McBride and relocation of the nerve.

## 2019-04-12 ENCOUNTER — Ambulatory Visit
Admission: RE | Admit: 2019-04-12 | Discharge: 2019-04-12 | Disposition: A | Discharge status: Home / Self Care | Payer: 59 | Source: Ambulatory Visit | Attending: Family Medicine | Admitting: Family Medicine

## 2019-04-12 ENCOUNTER — Other Ambulatory Visit: Payer: Self-pay

## 2019-04-12 DIAGNOSIS — Z1231 Encounter for screening mammogram for malignant neoplasm of breast: Secondary | ICD-10-CM

## 2019-05-09 ENCOUNTER — Ambulatory Visit: Payer: 59 | Admitting: Podiatry

## 2020-03-12 ENCOUNTER — Other Ambulatory Visit (HOSPITAL_COMMUNITY): Payer: Self-pay | Admitting: *Deleted

## 2020-03-12 DIAGNOSIS — R1310 Dysphagia, unspecified: Secondary | ICD-10-CM

## 2020-03-26 ENCOUNTER — Ambulatory Visit (HOSPITAL_COMMUNITY)
Admission: RE | Admit: 2020-03-26 | Discharge: 2020-03-26 | Disposition: A | Discharge status: Home / Self Care | Payer: 59 | Source: Ambulatory Visit | Attending: Gastroenterology | Admitting: Gastroenterology

## 2020-03-26 ENCOUNTER — Other Ambulatory Visit: Payer: Self-pay

## 2020-03-26 DIAGNOSIS — R1310 Dysphagia, unspecified: Secondary | ICD-10-CM | POA: Insufficient documentation

## 2020-04-21 ENCOUNTER — Other Ambulatory Visit: Payer: Self-pay | Admitting: Family Medicine

## 2020-04-21 DIAGNOSIS — Z1231 Encounter for screening mammogram for malignant neoplasm of breast: Secondary | ICD-10-CM

## 2020-06-19 ENCOUNTER — Ambulatory Visit
Admission: RE | Admit: 2020-06-19 | Discharge: 2020-06-19 | Disposition: A | Discharge status: Home / Self Care | Payer: 59 | Source: Ambulatory Visit | Attending: Family Medicine | Admitting: Family Medicine

## 2020-06-19 ENCOUNTER — Other Ambulatory Visit: Payer: Self-pay

## 2020-06-19 DIAGNOSIS — Z1231 Encounter for screening mammogram for malignant neoplasm of breast: Secondary | ICD-10-CM

## 2020-12-28 ENCOUNTER — Emergency Department (HOSPITAL_COMMUNITY)
Admission: EM | Admit: 2020-12-28 | Discharge: 2020-12-28 | Disposition: A | Discharge status: Home / Self Care | Payer: 59 | Attending: Emergency Medicine | Admitting: Emergency Medicine

## 2020-12-28 ENCOUNTER — Encounter (HOSPITAL_COMMUNITY): Payer: Self-pay | Admitting: *Deleted

## 2020-12-28 ENCOUNTER — Other Ambulatory Visit: Payer: Self-pay

## 2020-12-28 ENCOUNTER — Emergency Department (HOSPITAL_COMMUNITY): Payer: 59

## 2020-12-28 DIAGNOSIS — Z79899 Other long term (current) drug therapy: Secondary | ICD-10-CM | POA: Insufficient documentation

## 2020-12-28 DIAGNOSIS — Z96611 Presence of right artificial shoulder joint: Secondary | ICD-10-CM | POA: Diagnosis not present

## 2020-12-28 DIAGNOSIS — Z96651 Presence of right artificial knee joint: Secondary | ICD-10-CM | POA: Diagnosis not present

## 2020-12-28 DIAGNOSIS — Z87891 Personal history of nicotine dependence: Secondary | ICD-10-CM | POA: Insufficient documentation

## 2020-12-28 DIAGNOSIS — N200 Calculus of kidney: Secondary | ICD-10-CM | POA: Diagnosis not present

## 2020-12-28 DIAGNOSIS — R1012 Left upper quadrant pain: Secondary | ICD-10-CM | POA: Diagnosis present

## 2020-12-28 LAB — BASIC METABOLIC PANEL
Anion gap: 10 (ref 5–15)
BUN: 19 mg/dL (ref 6–20)
CO2: 22 mmol/L (ref 22–32)
Calcium: 9.6 mg/dL (ref 8.9–10.3)
Chloride: 104 mmol/L (ref 98–111)
Creatinine, Ser: 1.05 mg/dL — ABNORMAL HIGH (ref 0.44–1.00)
GFR, Estimated: >60 mL/min (ref >60)
Glucose, Bld: 119 mg/dL — ABNORMAL HIGH (ref 70–99)
Potassium: 3.6 mmol/L (ref 3.5–5.1)
Sodium: 136 mmol/L (ref 135–145)

## 2020-12-28 LAB — CBC
HCT: 42.5 % (ref 36.0–46.0)
Hemoglobin: 14.6 g/dL (ref 12.0–15.0)
MCH: 31.5 pg (ref 26.0–34.0)
MCHC: 34.4 g/dL (ref 30.0–36.0)
MCV: 91.8 fL (ref 80.0–100.0)
Platelets: 299 K/uL (ref 150–400)
RBC: 4.63 MIL/uL (ref 3.87–5.11)
RDW: 12.6 % (ref 11.5–15.5)
WBC: 8.9 K/uL (ref 4.0–10.5)
nRBC: 0 % (ref 0.0–0.2)

## 2020-12-28 LAB — URINALYSIS, ROUTINE W REFLEX MICROSCOPIC
Bilirubin Urine: NEGATIVE
Glucose, UA: NEGATIVE mg/dL
Ketones, ur: 20 mg/dL — AB
Leukocytes,Ua: NEGATIVE
Nitrite: NEGATIVE
Protein, ur: NEGATIVE mg/dL
Specific Gravity, Urine: 1.023 (ref 1.005–1.030)
pH: 7 (ref 5.0–8.0)

## 2020-12-28 MED ORDER — KETOROLAC TROMETHAMINE 30 MG/ML IJ SOLN
30.0000 mg | Freq: Once | INTRAMUSCULAR | Status: AC
  Administered 2020-12-28: 30 mg via INTRAVENOUS
  Filled 2020-12-28: qty 1

## 2020-12-28 MED ORDER — TAMSULOSIN HCL 0.4 MG PO CAPS: 0.4000 mg | ORAL_CAPSULE | Freq: Every day | ORAL | 0 refills | Status: AC

## 2020-12-28 MED ORDER — HYDROMORPHONE HCL 1 MG/ML IJ SOLN
1.0000 mg | Freq: Once | INTRAMUSCULAR | Status: AC
  Administered 2020-12-28: 1 mg via INTRAVENOUS
  Filled 2020-12-28: qty 1

## 2020-12-28 MED ORDER — HYDROCODONE-ACETAMINOPHEN 5-325 MG PO TABS
2.0000 | ORAL_TABLET | Freq: Once | ORAL | Status: AC
  Administered 2020-12-28: 2 via ORAL
  Filled 2020-12-28: qty 2

## 2020-12-28 MED ORDER — OXYCODONE-ACETAMINOPHEN 5-325 MG PO TABS: 1.0000 | ORAL_TABLET | Freq: Four times a day (QID) | ORAL | 0 refills | Status: DC | PRN

## 2020-12-28 MED ORDER — IBUPROFEN 800 MG PO TABS: 800.0000 mg | ORAL_TABLET | Freq: Three times a day (TID) | ORAL | 0 refills | Status: AC

## 2020-12-28 MED ORDER — ONDANSETRON 4 MG PO TBDP: 4.0000 mg | ORAL_TABLET | Freq: Three times a day (TID) | ORAL | 0 refills | Status: DC | PRN

## 2020-12-28 MED ORDER — ONDANSETRON HCL 4 MG/2ML IJ SOLN
4.0000 mg | Freq: Once | INTRAMUSCULAR | Status: AC
  Administered 2020-12-28: 4 mg via INTRAVENOUS
  Filled 2020-12-28: qty 2

## 2020-12-28 NOTE — Discharge Instructions (Addendum)
Ibuprofen 3 times daily, Zofran every 6 hours as needed for nausea, Flomax once a day for 5 days, oxycodone with Tylenol every 6 hours as needed for severe pain only.  This is a 3 mm kidney stone and should pass relatively quickly however that may be a couple of days, it is unpredictable so drink plenty of clear liquids.  ER for severe worsening symptoms

## 2020-12-28 NOTE — ED Notes (Signed)
Patient transported to CT 

## 2020-12-28 NOTE — ED Triage Notes (Signed)
Pt states she woke up this am with left side flank pain; pt states the pain has gotten worse throughout the day; pt has been vomiting; pt has hx of kidney stones; pt states she is unable to urinate at this time

## 2020-12-28 NOTE — ED Provider Notes (Signed)
[facility] EMERGENCY DEPARTMENT Provider Note   CSN: [id number] Arrival date & time: 12/28/20  1921     History Chief Complaint  Patient presents with   Flank Pain    [name] is a 55 y.o. female.   Flank Pain   This patient is a 55 year old female history of prior kidney stones prior lithotripsy, presents with 1 day of symptoms involving her left upper quadrant with radiation to the left flank, sharp, severe, ongoing, seems to wax and wane, associated with nausea vomiting and diaphoresis.  She has not noticed any hematuria or dysuria, she does have a prior history of requiring lithotripsy.  No medications given prior to arrival  Past Medical History:  Diagnosis Date   History of kidney stones    Mass of finger of right hand    THUMB   PONV (postoperative nausea and vomiting)    Wears glasses     Patient Active Problem List   Diagnosis Date Noted   S/P right knee arthroscopy 03/15/2012   Pes anserinus bursitis 01/30/2012   Body, loose, knee 12/05/2011   Medial meniscus, posterior horn derangement 11/22/2011    Past Surgical History:  Procedure Laterality Date   BREAST EXCISIONAL BIOPSY Right    right axilla   CERVICAL BIOPSY  W/ LOOP ELECTRODE EXCISION  age 31   EXCISION AXILLARY MASS Right 07-20-1998   BENIGN   EXTRACORPOREAL SHOCK WAVE LITHOTRIPSY  2008   INJECTION KNEE  01/06/2012   Procedure: KNEE INJECTION;  Surgeon: Vickki Hearing, MD;  Location: [facility];  Service: Orthopedics;  Laterality: Right;   KNEE ARTHROSCOPY  01/06/2012   Procedure: ARTHROSCOPY KNEE;  Surgeon: Vickki Hearing, MD;  Location: [facility];  Service: Orthopedics;  Laterality: Right;   LAPAROSCOPIC CHOLECYSTECTOMY  2007   LAPAROSCOPY/  LASER ABLATION ENDOMETRIOSIS/  BILATERAL TUBAL LIGATION WITH FILSHIE CLIPS  01-12-2004   LEFT GREAT TOE EXCISION BONE SPURS  Nov  2015   MASS EXCISION Right 10/30/2014   Procedure: RIGHT THUMB DEEP MASS EXCISION ;  Surgeon: Bradly Bienenstock, MD;   Location: [facility] [place];  Service: Orthopedics;  Laterality: Right;   SHOULDER ARTHROSCOPY WITH ROTATOR CUFF REPAIR AND SUBACROMIAL DECOMPRESSION Right 01-08-2009   w/ Debridement labral tear/  DCR/  Acromioplasty     OB History   No obstetric history on file.     Family History  Problem Relation Age of Onset   Cancer Other    Breast cancer Paternal Grandmother        unsur e of age    Social History   Tobacco Use   Smoking status: Former    Packs/day: 0.12    Years: 10.00    Pack years: 1.20    Types: Cigarettes    Quit date: 10/22/1993    Years since quitting: 27.2   Smokeless tobacco: Never  Substance Use Topics   Alcohol use: Yes    Comment: occaionsal   Drug use: No    Home Medications Prior to Admission medications   Medication Sig Start Date End Date Taking? Authorizing Provider  ibuprofen (ADVIL) 800 MG tablet Take 1 tablet (800 mg total) by mouth 3 (three) times daily. 12/28/20  Yes Eber Hong, MD  ondansetron (ZOFRAN ODT) 4 MG disintegrating tablet Take 1 tablet (4 mg total) by mouth every 8 (eight) hours as needed for nausea. 12/28/20  Yes Eber Hong, MD  oxyCODONE-acetaminophen (PERCOCET) 5-325 MG tablet Take 1 tablet by mouth every 6 (six) hours as  needed. 12/28/20  Yes Eber HongMiller, , MD  tamsulosin (FLOMAX) 0.4 MG CAPS capsule Take 1 capsule (0.4 mg total) by mouth daily for 5 days. 12/28/20 01/02/21 Yes Eber HongMiller, , MD  Calcium Carbonate (CALCIUM 500 PO) Take by mouth.    [provider]  cetirizine (ZYRTEC) 10 MG tablet Take 10 mg by mouth daily.    [provider]  escitalopram (LEXAPRO) 10 MG tablet  11/11/18   [provider]  Glucosamine 750 MG TABS Take by mouth.    [provider]  hydrocortisone (ANUSOL-HC) 25 MG suppository Place 25 mg rectally at bedtime. 08/07/18   [provider]  metroNIDAZOLE (METROCREAM) 0.75 % cream APPLY TO AFFECTED AREASATWICE DAILY. 12/11/18   [provider]  Multiple Vitamin (MULTIVITAMIN) capsule Take 1 capsule by mouth daily.    [provider]  pantoprazole (PROTONIX) 20 MG tablet Take 20 mg by mouth daily. 09/04/18   [provider]  pantoprazole (PROTONIX) 40 MG tablet  11/02/18   [provider]  valACYclovir (VALTREX) 1000 MG tablet SMARTSIG:1 Tablet(s) By Mouth Every 12 Hours 09/20/18   [provider]  valACYclovir (VALTREX) 500 MG tablet  09/27/18   [provider]    Allergies    Levaquin [levofloxacin], Penicillins, and Sulfa antibiotics  Review of Systems   Review of Systems  Genitourinary:  Positive for flank pain.  All other systems reviewed and are negative.  Physical Exam Updated Vital Signs BP 129/78   Pulse 73   Temp 98 F (36.7 C) (Oral)   Resp 18   LMP 10/17/2014 (Exact Date)   SpO2 92%   Physical Exam Vitals and nursing note reviewed.  Constitutional:      Appearance: She is well-developed. She is diaphoretic.     Comments: Uncomfortable appearing, diaphoretic  HENT:     Head: Normocephalic and atraumatic.     Mouth/Throat:     Pharynx: No oropharyngeal exudate.  Eyes:     General: No scleral icterus.       Right eye: No discharge.        Left eye: No discharge.     Conjunctiva/sclera: Conjunctivae normal.     Pupils: Pupils are equal, round, and reactive to light.  Neck:     Thyroid: No thyromegaly.     Vascular: No JVD.  Cardiovascular:     Rate and Rhythm: Normal rate and regular rhythm.     Heart sounds: Normal heart sounds. No murmur heard.   No friction rub. No gallop.  Pulmonary:     Effort: Pulmonary effort is normal. No respiratory distress.     Breath sounds: Normal breath sounds. No wheezing or rales.  Abdominal:     General: Bowel sounds are normal. There is no distension.     Palpations: Abdomen is soft. There is no mass.     Tenderness: There is no abdominal tenderness.     Comments: Mild left upper quadrant tenderness, left  CVA tenderness present  Musculoskeletal:        General: No tenderness. Normal range of motion.     Cervical back: Normal range of motion and neck supple.  Lymphadenopathy:     Cervical: No cervical adenopathy.  Skin:    General: Skin is warm.     Findings: No erythema or rash.  Neurological:     General: No focal deficit present.     Mental Status: She is alert.     Coordination: Coordination normal.  Psychiatric:  Behavior: Behavior normal.    ED Results / Procedures / Treatments   Labs (all labs ordered are listed, but only abnormal results are displayed) Labs Reviewed  URINALYSIS, ROUTINE W REFLEX MICROSCOPIC - Abnormal; Notable for the following components:      Result Value   APPearance HAZY (*)    Hgb urine dipstick MODERATE (*)    Ketones, ur 20 (*)    Bacteria, UA RARE (*)    All other components within normal limits  BASIC METABOLIC PANEL - Abnormal; Notable for the following components:   Glucose, Bld 119 (*)    Creatinine, Ser 1.05 (*)    All other components within normal limits  CBC    EKG None  Radiology CT Renal Stone Study  Addendum Date: 12/28/2020   ADDENDUM REPORT: 12/28/2020 21:00 ADDENDUM: 3 cm probable cyst lower pole left kidney. Electronically Signed   By: Donavan Foil M.D.   On: 12/28/2020 21:00   Result Date: 12/28/2020 CLINICAL DATA:  Left-sided flank pain EXAM: CT ABDOMEN AND PELVIS WITHOUT CONTRAST TECHNIQUE: Multidetector CT imaging of the abdomen and pelvis was performed following the standard protocol without IV contrast. COMPARISON:  None. FINDINGS: Lower chest: Lung bases demonstrate punctate 1-2 mm lung nodules. No acute consolidation or effusion. Hepatobiliary: Hepatic steatosis. Status post cholecystectomy. No biliary dilatation Pancreas: Unremarkable. No pancreatic ductal dilatation or surrounding inflammatory changes. Spleen: Normal in size without focal abnormality. Adrenals/Urinary Tract: Adrenal glands are normal. There are  bilateral intrarenal stones, on the right measuring up to 6 mm at the lower pole and on the left measuring up to 3 mm lower pole. Mild left hydronephrosis, secondary to a 3 mm stone within the proximal left ureter about a cm distal to the left UPJ. The bladder is unremarkable. Stomach/Bowel: Stomach is within normal limits. Appendix appears normal. No evidence of bowel wall thickening, distention, or inflammatory changes. Vascular/Lymphatic: Mild aortic atherosclerosis. No aneurysm. No suspicious nodes Reproductive: Uterus unremarkable. Bilateral ligation clips. No adnexal mass Other: No abdominal wall hernia or abnormality. No abdominopelvic ascites. Musculoskeletal: No acute or significant osseous findings. IMPRESSION: 1. Mild left hydronephrosis, secondary to a proximal 3 mm ureteral stone about a cm distal to the left UPJ 2. Bilateral intrarenal stones 3. Hepatic steatosis Electronically Signed: By: Donavan Foil M.D. On: 12/28/2020 20:52    Procedures Procedures   Medications Ordered in ED Medications  HYDROcodone-acetaminophen (NORCO/VICODIN) 5-325 MG per tablet 2 tablet (has no administration in time range)  ketorolac (TORADOL) 30 MG/ML injection 30 mg (30 mg Intravenous Given 12/28/20 2034)  HYDROmorphone (DILAUDID) injection 1 mg (1 mg Intravenous Given 12/28/20 2034)  ondansetron (ZOFRAN) injection 4 mg (4 mg Intravenous Given 12/28/20 2034)    ED Course  I have reviewed the triage vital signs and the nursing notes.  Pertinent labs & imaging results that were available during my care of the patient were reviewed by me and considered in my medical decision making (see chart for details).    MDM Rules/Calculators/A&P                           Vital signs are unremarkable however the patient appears colicky consistent with prior nephrolithiasis I suspect she has an obstructing stone, pain medications ordered, CT scan ordered as she has needed lithotripsy in the past, patient agreed  57mm  stone - has good sx control prior to d/c.  Final Clinical Impression(s) / ED Diagnoses Final diagnoses:  Kidney stone  on left side    Rx / DC Orders ED Discharge Orders          Ordered    tamsulosin (FLOMAX) 0.4 MG CAPS capsule  Daily        12/28/20 2257    ibuprofen (ADVIL) 800 MG tablet  3 times daily        12/28/20 2257    ondansetron (ZOFRAN ODT) 4 MG disintegrating tablet  Every 8 hours PRN        12/28/20 2257    oxyCODONE-acetaminophen (PERCOCET) 5-325 MG tablet  Every 6 hours PRN        12/28/20 2257             Noemi Chapel, MD 12/28/20 2258

## 2021-01-06 ENCOUNTER — Other Ambulatory Visit: Payer: Self-pay | Admitting: Urology

## 2021-01-11 ENCOUNTER — Encounter (HOSPITAL_BASED_OUTPATIENT_CLINIC_OR_DEPARTMENT_OTHER): Payer: Self-pay | Admitting: Urology

## 2021-01-11 ENCOUNTER — Ambulatory Visit (HOSPITAL_BASED_OUTPATIENT_CLINIC_OR_DEPARTMENT_OTHER): Payer: 59 | Admitting: Anesthesiology

## 2021-01-11 ENCOUNTER — Encounter (HOSPITAL_BASED_OUTPATIENT_CLINIC_OR_DEPARTMENT_OTHER)
Admission: RE | Disposition: A | Discharge status: Home / Self Care | Payer: Self-pay | Source: Ambulatory Visit | Attending: Urology

## 2021-01-11 ENCOUNTER — Ambulatory Visit (HOSPITAL_BASED_OUTPATIENT_CLINIC_OR_DEPARTMENT_OTHER)
Admission: RE | Admit: 2021-01-11 | Discharge: 2021-01-11 | Disposition: A | Discharge status: Home / Self Care | Payer: 59 | Source: Ambulatory Visit | Attending: Urology | Admitting: Urology

## 2021-01-11 ENCOUNTER — Other Ambulatory Visit: Payer: Self-pay

## 2021-01-11 ENCOUNTER — Other Ambulatory Visit: Payer: Self-pay | Admitting: Urology

## 2021-01-11 DIAGNOSIS — N201 Calculus of ureter: Secondary | ICD-10-CM

## 2021-01-11 DIAGNOSIS — X58XXXA Exposure to other specified factors, initial encounter: Secondary | ICD-10-CM | POA: Insufficient documentation

## 2021-01-11 DIAGNOSIS — T8389XA Other specified complication of genitourinary prosthetic devices, implants and grafts, initial encounter: Secondary | ICD-10-CM | POA: Insufficient documentation

## 2021-01-11 HISTORY — PX: CYSTOSCOPY WITH RETROGRADE PYELOGRAM, URETEROSCOPY AND STENT PLACEMENT: SHX5789

## 2021-01-11 LAB — POCT I-STAT, CHEM 8
BUN: 12 mg/dL (ref 6–20)
Calcium, Ion: 1.24 mmol/L (ref 1.15–1.40)
Chloride: 104 mmol/L (ref 98–111)
Creatinine, Ser: 0.7 mg/dL (ref 0.44–1.00)
Glucose, Bld: 94 mg/dL (ref 70–99)
HCT: 41 % (ref 36.0–46.0)
Hemoglobin: 13.9 g/dL (ref 12.0–15.0)
Potassium: 3.6 mmol/L (ref 3.5–5.1)
Sodium: 139 mmol/L (ref 135–145)
TCO2: 24 mmol/L (ref 22–32)

## 2021-01-11 SURGERY — CYSTOURETEROSCOPY, WITH RETROGRADE PYELOGRAM AND STENT INSERTION
Anesthesia: General | Site: Renal | Laterality: Left

## 2021-01-11 MED ORDER — MIDAZOLAM HCL 2 MG/2ML IJ SOLN
INTRAMUSCULAR | Status: AC
  Filled 2021-01-11: qty 2

## 2021-01-11 MED ORDER — OXYCODONE HCL 5 MG PO TABS
5.0000 mg | ORAL_TABLET | Freq: Once | ORAL | Status: AC | PRN
  Administered 2021-01-11: 5 mg via ORAL

## 2021-01-11 MED ORDER — ONDANSETRON HCL 4 MG/2ML IJ SOLN
4.0000 mg | Freq: Once | INTRAMUSCULAR | Status: AC | PRN
  Administered 2021-01-11: 4 mg via INTRAVENOUS

## 2021-01-11 MED ORDER — PROPOFOL 10 MG/ML IV BOLUS
INTRAVENOUS | Status: DC | PRN
  Administered 2021-01-11: 150 mg via INTRAVENOUS

## 2021-01-11 MED ORDER — FENTANYL CITRATE (PF) 100 MCG/2ML IJ SOLN
INTRAMUSCULAR | Status: AC
  Filled 2021-01-11: qty 2

## 2021-01-11 MED ORDER — IOHEXOL 300 MG/ML  SOLN
INTRAMUSCULAR | Status: DC | PRN
  Administered 2021-01-11: 10 mL

## 2021-01-11 MED ORDER — OXYCODONE HCL 5 MG PO TABS
ORAL_TABLET | ORAL | Status: AC
  Filled 2021-01-11: qty 1

## 2021-01-11 MED ORDER — OXYCODONE-ACETAMINOPHEN 5-325 MG PO TABS: 1.0000 | ORAL_TABLET | Freq: Four times a day (QID) | ORAL | 0 refills | Status: DC | PRN

## 2021-01-11 MED ORDER — ONDANSETRON HCL 4 MG/2ML IJ SOLN
INTRAMUSCULAR | Status: DC | PRN
  Administered 2021-01-11: 4 mg via INTRAVENOUS

## 2021-01-11 MED ORDER — FENTANYL CITRATE (PF) 100 MCG/2ML IJ SOLN
INTRAMUSCULAR | Status: DC | PRN
  Administered 2021-01-11: 50 ug via INTRAVENOUS
  Administered 2021-01-11: 25 ug via INTRAVENOUS

## 2021-01-11 MED ORDER — DEXAMETHASONE SODIUM PHOSPHATE 4 MG/ML IJ SOLN
INTRAMUSCULAR | Status: DC | PRN
  Administered 2021-01-11: 8 mg via INTRAVENOUS

## 2021-01-11 MED ORDER — OXYCODONE HCL 5 MG/5ML PO SOLN: 5.0000 mg | Freq: Once | ORAL | Status: AC | PRN

## 2021-01-11 MED ORDER — MIDAZOLAM HCL 2 MG/2ML IJ SOLN
INTRAMUSCULAR | Status: DC | PRN
  Administered 2021-01-11: 2 mg via INTRAVENOUS

## 2021-01-11 MED ORDER — LIDOCAINE HCL (CARDIAC) PF 100 MG/5ML IV SOSY
PREFILLED_SYRINGE | INTRAVENOUS | Status: DC | PRN
  Administered 2021-01-11: 60 mg via INTRAVENOUS

## 2021-01-11 MED ORDER — ONDANSETRON HCL 4 MG/2ML IJ SOLN
INTRAMUSCULAR | Status: AC
  Filled 2021-01-11: qty 2

## 2021-01-11 MED ORDER — NITROFURANTOIN MONOHYD MACRO 100 MG PO CAPS: 100.0000 mg | ORAL_CAPSULE | Freq: Two times a day (BID) | ORAL | 0 refills | Status: AC

## 2021-01-11 MED ORDER — SCOPOLAMINE 1 MG/3DAYS TD PT72
1.0000 | MEDICATED_PATCH | TRANSDERMAL | Status: DC
  Administered 2021-01-11: 1.5 mg via TRANSDERMAL

## 2021-01-11 MED ORDER — SCOPOLAMINE 1 MG/3DAYS TD PT72
MEDICATED_PATCH | TRANSDERMAL | Status: AC
  Filled 2021-01-11: qty 1

## 2021-01-11 MED ORDER — STERILE WATER FOR IRRIGATION IR SOLN
Status: DC | PRN
  Administered 2021-01-11: 500 mL

## 2021-01-11 MED ORDER — BELLADONNA ALKALOIDS-OPIUM 16.2-60 MG RE SUPP
RECTAL | Status: DC | PRN
  Administered 2021-01-11: 1 via RECTAL

## 2021-01-11 MED ORDER — BELLADONNA ALKALOIDS-OPIUM 16.2-60 MG RE SUPP
RECTAL | Status: AC
  Filled 2021-01-11: qty 1

## 2021-01-11 MED ORDER — STERILE WATER FOR IRRIGATION IR SOLN
Status: DC | PRN
  Administered 2021-01-11: 3000 mL

## 2021-01-11 MED ORDER — LACTATED RINGERS IV SOLN: INTRAVENOUS | Status: DC

## 2021-01-11 MED ORDER — GENTAMICIN SULFATE 40 MG/ML IJ SOLN
5.0000 mg/kg | INTRAVENOUS | Status: AC
  Administered 2021-01-11: 290 mg via INTRAVENOUS
  Filled 2021-01-11: qty 7.25

## 2021-01-11 SURGICAL SUPPLY — 25 items
BAG DRAIN URO-CYSTO SKYTR STRL (DRAIN) ×2 IMPLANT
BAG DRN UROCATH (DRAIN) ×1
BASKET LASER NITINOL 1.9FR (BASKET) IMPLANT
BASKET ZERO TIP NITINOL 2.4FR (BASKET) ×1 IMPLANT
BSKT STON RTRVL 120 1.9FR (BASKET)
BSKT STON RTRVL ZERO TP 2.4FR (BASKET) ×1
CATH URET 5FR 28IN OPEN ENDED (CATHETERS) ×2 IMPLANT
CATH URET DUAL LUMEN 6-10FR 50 (CATHETERS) ×1 IMPLANT
CLOTH BEACON ORANGE TIMEOUT ST (SAFETY) ×2 IMPLANT
EXTRACTOR STONE 1.7FRX115CM (UROLOGICAL SUPPLIES) IMPLANT
GLOVE SURG ENC MOIS LTX SZ7.5 (GLOVE) ×4 IMPLANT
GOWN STRL REUS W/TWL XL LVL3 (GOWN DISPOSABLE) ×3 IMPLANT
GUIDEWIRE STR DUAL SENSOR (WIRE) ×2 IMPLANT
IV NS IRRIG 3000ML ARTHROMATIC (IV SOLUTION) ×2 IMPLANT
KIT TURNOVER CYSTO (KITS) ×2 IMPLANT
MANIFOLD NEPTUNE II (INSTRUMENTS) ×2 IMPLANT
NS IRRIG 500ML POUR BTL (IV SOLUTION) ×2 IMPLANT
PACK CYSTO (CUSTOM PROCEDURE TRAY) ×2 IMPLANT
STENT URET 6FRX24 CONTOUR (STENTS) ×1 IMPLANT
TRACTIP FLEXIVA PULS ID 200XHI (Laser) IMPLANT
TRACTIP FLEXIVA PULSE ID 200 (Laser)
TUBE CONNECTING 12X1/4 (SUCTIONS) IMPLANT
TUBING UROLOGY SET (TUBING) ×1 IMPLANT
WATER STERILE IRR 3000ML UROMA (IV SOLUTION) ×1 IMPLANT
WATER STERILE IRR 500ML POUR (IV SOLUTION) ×1 IMPLANT

## 2021-01-11 NOTE — Discharge Instructions (Addendum)
DISCHARGE INSTRUCTIONS FOR KIDNEY STONE/URETERAL STENT   MEDICATIONS:  1.  Resume all your other meds from home - except do not take any extra narcotic pain meds that you may have at home.  2. Oxycodone/Acetaminaphen is for moderate/severe pain, otherwise taking upto 1000 mg every 6 hours of plainTylenol will help treat your pain.   4. Take antibiotic (Macrobid) until the stent is removed.  ACTIVITY:  1. No strenuous activity x 1week  2. No driving while on narcotic pain medications  3. Drink plenty of water  4. Continue to walk at home - you can still get blood clots when you are at home, so keep active, but don't over do it.  5. May return to work/school tomorrow or when you feel ready   BATHING:  1. You can shower and we recommend daily showers  2. You have a string coming from your urethra: The stent string is attached to your ureteral stent. Do not pull on this.   SIGNS/SYMPTOMS TO CALL:  Please call us if you have a fever greater than 101.5, uncontrolled nausea/vomiting, uncontrolled pain, dizziness, unable to urinate, bloody urine, chest pain, shortness of breath, leg swelling, leg pain, redness around wound, drainage from wound, or any other concerns or questions.   You can reach [place] at [phone].   FOLLOW-UP:  1. You have an appointment in 6 weeks with a ultrasound of your kidneys prior. 2. You have a string attached to your stent, you may remove it on Monday Nov 28th.  To do this, pull the strings until the stents are completely removed. You may feel an odd sensation in your back.  Post Anesthesia Home Care Instructions  Activity: Get plenty of rest for the remainder of the day. A responsible adult should stay with you for 24 hours following the procedure.  For the next 24 hours, DO NOT: -Drive a car -Advertising copywriter -Drink alcoholic beverages -Take any medication unless instructed by your physician -Make any legal decisions or sign important papers.  Meals: Start  with liquid foods such as gelatin or soup. Progress to regular foods as tolerated. Avoid greasy, spicy, heavy foods. If nausea and/or vomiting occur, drink only clear liquids until the nausea and/or vomiting subsides. Call your physician if vomiting continues.  Special Instructions/Symptoms: Your throat may feel dry or sore from the anesthesia or the breathing tube placed in your throat during surgery. If this causes discomfort, gargle with warm salt water. The discomfort should disappear within 24 hours.  If you had a scopolamine patch placed behind your ear for the management of post- operative nausea and/or vomiting:  1. The medication in the patch is effective for 72 hours, after which it should be removed.  Wrap patch in a tissue and discard in the trash. Wash hands thoroughly with soap and water. 2. You may remove the patch earlier than 72 hours if you experience unpleasant side effects which may include dry mouth, dizziness or visual disturbances. 3. Avoid touching the patch. Wash your hands with soap and water after contact with the patch.

## 2021-01-11 NOTE — Anesthesia Preprocedure Evaluation (Signed)
Anesthesia Evaluation  Patient identified by MRN, date of birth, ID band Patient awake    Reviewed: Allergy & Precautions, NPO status , Patient's Chart, lab work & pertinent test results  History of Anesthesia Complications (+) PONV and history of anesthetic complications  Airway Mallampati: II  TM Distance: >3 FB Neck ROM: Full    Dental no notable dental hx. (+) Teeth Intact, Dental Advisory Given   Pulmonary former smoker,    Pulmonary exam normal breath sounds clear to auscultation       Cardiovascular negative cardio ROS Normal cardiovascular exam Rhythm:Regular Rate:Normal     Neuro/Psych negative neurological ROS  negative psych ROS   GI/Hepatic negative GI ROS, Neg liver ROS,   Endo/Other  Obesity  Renal/GU Indwelling left ureteral stent  negative genitourinary   Musculoskeletal  (+) Arthritis , Osteoarthritis,    Abdominal (+) + obese,   Peds  Hematology negative hematology ROS (+)   Anesthesia Other Findings   Reproductive/Obstetrics                             Anesthesia Physical Anesthesia Plan  ASA: 2  Anesthesia Plan: General   Post-op Pain Management:    Induction: Intravenous  PONV Risk Score and Plan: 4 or greater and Diphenhydramine, Scopolamine patch - Pre-op, Midazolam, Ondansetron, Dexamethasone and Treatment may vary due to age or medical condition  Airway Management Planned: LMA  Additional Equipment: None  Intra-op Plan:   Post-operative Plan: Extubation in OR  Informed Consent: I have reviewed the patients History and Physical, chart, labs and discussed the procedure including the risks, benefits and alternatives for the proposed anesthesia with the patient or authorized representative who has indicated his/her understanding and acceptance.     Dental advisory given  Plan Discussed with: CRNA and Anesthesiologist  Anesthesia Plan Comments:          Anesthesia Quick Evaluation

## 2021-01-11 NOTE — Op Note (Signed)
Preoperative diagnosis:  Left migrated stent  Postoperative diagnosis:  same   Procedure: Cystoscopy, left ureteroscopy, stent retrieval Left retrograde pyelogram with interpretation Left ureteral stent placement  Surgeon: Crist Fat, MD  Anesthesia: General  Complications: None  Intraoperative findings:  1.:  The patient's left ureteral stent had migrated to the distal ureter where it was looped.  This was retrieved using a 0 tip basket and the ureteroscope. 2.:  The patient's left retrograde pyelogram demonstrated some extravasation of contrast at the left UPJ, consistent with a mild UPJ disruption. 3.:  A left ureteral stent was replaced, with a nice curl noted both in the renal pelvis as well as in the bladder.  A stent tether was left on the stent and tucked into the patient's vagina.  EBL: Minimal  Specimens: None  Indication: [name] is a 55 y.o. patient with History of bilateral ureteral stones and is status post bilateral ureteroscopy.  She had significant left-sided discomfort throughout the course of her postoperative recovery.  She presented today to the office to have her stents removed.  I was unable to remove the patient's left stent, because it had migrated up into the patient's left ureter..  After reviewing the management options for treatment, he elected to proceed with the above surgical procedure(s). We have discussed the potential benefits and risks of the procedure, side effects of the proposed treatment, the likelihood of the patient achieving the goals of the procedure, and any potential problems that might occur during the procedure or recuperation. Informed consent has been obtained.  Description of procedure:  The patient was taken to the operating room and general anesthesia was induced.  The patient was placed in the dorsal lithotomy position, prepped and draped in the usual sterile fashion, and preoperative antibiotics were administered.  A preoperative time-out was performed.   21 French 30 degree cystoscope was gently passed to the patient's urethra and into the bladder under visual guidance.  Cystoscopy was performed noting no significant bladder abnormalities.  The ureteral orifice ease were orthotopic.  The left ureteral stent was not present in the patient's left ureteral orifice.  I subsequently exchanged a 30 degree cystoscope for the 6 single-lumen semirigid ureteroscope.  I was able to easily cannulate the patient's left ureteral orifice and encountered the patient stent approximately 4 cm proximal to the UVJ.  Using a 0 tip basket I was able to loop the basket around the distal aspect of the stent and easily remove it.  I pulled it to the patient's urethral meatus and then passed a wire through the stent and into the left renal pelvis.  I subsequently advanced an open-ended catheter over the wire and into the left renal pelvis.  I slowly pulled back and injected contrast filling up the patient renal pelvis as well as the proximal ureter.  I noted at this time that the proximal ureter had extravasation.  The renal pelvis was normal.  There was no additional findings or abnormalities.  I subsequently replaced the wire and remove the open-ended catheter.  I then advanced a 24 cm time 6 French double-J stent over the wire and into the left UPJ.  Once it was noted to be well within the renal pelvis I advanced it up to the bladder neck before removing the wire.  I pulled the stent tether through the patient sure [name] contacted into [place].  I then repassed the cystoscope noting that the stent was well-positioned and there was urine effluxing  through the holes.  The patient's bladder was subsequently emptied I placed a BNO suppository into her rectum.  She was subsequently extubated returned the PACU in stable condition.  Disposition: The patient will be discharged home and recommended that she remove the stent in 1 week.  She has follow-up  with a renal ultrasound in 6 weeks.  Crist Fat, M.D.

## 2021-01-11 NOTE — H&P (Signed)
55 year old female who presents today for follow-up from the emergency room where she was seen with acute onset left-sided renal colic. The patient was diagnosed with a 3 mm left proximal ureteral stone. She has bilateral nonobstructing stones as well. The patient was managed with IV pain medication and her pain improved. She was discharged home with pain medication, Flomax, and Zofran. Over the course of the last 3 days the patient has noted intense pain, but it seems to have relaxed over the last 12 hours. She does complain of some tingling with urination. She denies any fevers or chills. She denies any dysuria. She denies any frequency/urgency. She also was not having any gross hematuria.   The patient has a history of nephrolithiasis and had lithotripsy in the past.   Interval: The patient is status post bilateral ureteroscopy. This was performed on 01/06/2021. The patient had stents placed bilaterally at the time of the procedure. She has had severe left-sided pain since her procedure was performed. She had an ultrasound and KUB the day after surgery to ensure that things appeared to be okay based on her pain. These images were reassuring. Today the patient is here for stent removal. She has had ongoing left-sided flank pain. The pain has been quite severe. She has a difficult time sitting up for long periods of time. She has not had any fevers or chills. She denies any dysuria, frequency, urgency. Her pain is mostly in the left lower quadrant. The patient has not had any bowel movement since the surgery. She feels quite constipated as well.     ALLERGIES: Azithromycin TABS Levaquin TABS Penicillins Sulfa Drugs    MEDICATIONS: Ditropan Xl 10 mg tablet, extended release 24 hr 1 tablet PO Daily  Ondansetron Hcl 4 mg tablet 1 tablet PO Q 4 H PRN  Percocet 5 mg-325 mg tablet 1 tablet PO Q 4 H PRN  Tamsulosin Hcl 0.4 mg capsule 1 capsule PO Daily  Ultram 50 mg tablet 1-2 tablet PO Q 6 H PRN   Calcium  DiazePAM 10 MG Oral Tablet 0 Oral Daily  Lexapro 10 mg tablet Oral  Multivitamin  Omeprazole 40 mg capsule,delayed release Oral  Pyridium 100 mg tablet 1 tablet PO Daily PRN  Zyrtec 10 mg tablet Oral     GU PSH: Ureteroscopic laser litho - 01/06/2021       PSH Notes: Hammertoe Operation (Each Toe), Rotator Cuff Repair, Cholecystectomy, Laparoscopy (Diagnostic), Lymph Node Biopsy - Needle, Tonsillectomy, Tubal Ligation   NON-GU PSH: Cholecystectomy (open) - 2008 Diag Laparo Separate Proc - 2008 Needle Biopsy Lymph Nodes - 2008 Remove Tonsils - 2008 Repair Hammertoe - 2017 Tubal Ligation - 2008     GU PMH: Flank Pain - 01/07/2021 Overactive bladder - 01/07/2021 Renal calculus - 01/07/2021, - 12/31/2020, Nephrolithiasis, - 2017, Kidney stone on right side, - 2014, Kidney stone on left side, - 2014 Suprapubic pain - 01/07/2021 Ureteral calculus - 12/31/2020, Ureteral Stone, - 2014 Pelvic/perineal pain, Female pelvic pain - 2017 Urinary Frequency, Increased urinary frequency - 2017 Urinary Urgency, Urinary urgency - 2017 Endometriosis, Unspec, Endometriosis - 2014 History of urolithiasis, Nephrolithiasis - 2014 LLQ pain, Abdominal Pain In The Left Lower Belly (LLQ) - 2014 LUQ pain, Abdominal Pain In The Left Upper Belly (LUQ) - 2014 Other microscopic hematuria, Microscopic Hematuria - 2014, Microscopic Hematuria, - 2014 Personal Hx Oth Urinary System diseases, History of urethritis - 2014 Stress Incontinence, Female Stress Incontinence - 2014    NON-GU PMH: Constipation, unspecified - 2019  Muscle weakness (generalized) - 2019 Encounter for general adult medical examination without abnormal findings, Encounter for preventive health examination - 2017 Other lack of coordination, Muscular incoordination - 2017, Other lack of coordination, - 2017 Other muscle spasm, Muscle spasm - 2017, Muscle spasm, - 2017 Other specified disorders of muscle, Pelvic floor dysfunction  - 2017 Personal history of other specified conditions, History of heartburn - 2017 Hypercalciuria, Hypercalciuria - 2014 Personal history of other diseases of the musculoskeletal system and connective tissue, History of low back pain - 2014 Anxiety Depression    FAMILY HISTORY: Death In The Family Father - Runs In Family Family Health Status - Mother's Age - Runs In Family Family Health Status Number - Runs In Family meningitis - Runs In Family pancreatic cancer - Runs In Family   SOCIAL HISTORY: Marital Status: Single Current Smoking Status: Patient does not smoke anymore. Has not smoked since 12/22/1993. Smoked for 15 years. Smoked less than 1/2 pack per day.   Tobacco Use Assessment Completed: Used Tobacco in last 30 days? Drinks 1 caffeinated drink per day. Patient's occupation Tax inspector.     Notes: Former smoker, Divorced, Alcohol use, Caffeine Use, Tobacco Use, Number of children, Occupation:, Living Independently With Spouse, Self-reliant In Usual Daily Activities, Activities Of Daily Living, Exercise Habits   REVIEW OF SYSTEMS:    GU Review Female:   Patient denies frequent urination, hard to postpone urination, burning /pain with urination, get up at night to urinate, leakage of urine, stream starts and stops, trouble starting your stream, have to strain to urinate, and being pregnant.  Gastrointestinal (Upper):   Patient denies nausea, vomiting, and indigestion/ heartburn.  Gastrointestinal (Lower):   Patient denies diarrhea and constipation.  Constitutional:   Patient denies fever, night sweats, weight loss, and fatigue.  Skin:   Patient denies skin rash/ lesion and itching.  Eyes:   Patient denies blurred vision and double vision.  Ears/ Nose/ Throat:   Patient denies sore throat and sinus problems.  Hematologic/Lymphatic:   Patient denies swollen glands and easy bruising.  Cardiovascular:   Patient denies chest pains and leg swelling.  Respiratory:   Patient  denies cough and shortness of breath.  Endocrine:   Patient denies excessive thirst.  Musculoskeletal:   Patient denies back pain and joint pain.  Neurological:   Patient denies headaches and dizziness.  Psychologic:   Patient denies depression and anxiety.   VITAL SIGNS: None   MULTI-SYSTEM PHYSICAL EXAMINATION:    Constitutional: Well-nourished. No physical deformities. Normally developed. Good grooming.  Respiratory: Normal breath sounds. No labored breathing, no use of accessory muscles.   Cardiovascular: Regular rate and rhythm. No murmur, no gallop. Normal temperature, normal extremity pulses, no swelling, no varicosities.   Gastrointestinal: Left, tender in the left lower quadrant and left CVA     Complexity of Data:  Source Of History:  Patient  Records Review:   Previous Doctor Records, Previous Hospital Records, Previous Patient Records  Urine Test Review:   Urinalysis   PROCEDURES:         Flexible Cystoscopy Right Stent Removal - 52310  Risks, benefits, and some of the potential complications of the procedure were discussed at length with the patient including infection, bleeding, voiding discomfort, urinary retention, fever, chills, sepsis, and others. All questions were answered. Informed consent was obtained. Antibiotic prophylaxis was given. Sterile technique and intraurethral analgesia were used.  Meatus:  Normal size. Normal location. Normal condition.  Urethra:  No hypermobility. No leakage.  Ureteral Orifices:  Normal location. Normal size. Normal shape. Effluxed clear urine.  Bladder:  No trabeculation. No tumors. Normal mucosa. No stones.  The right ureteral stent was carefully removed with a grasping instrument.    The lower urinary tract was carefully examined. The procedure was well-tolerated and without complications. Antibiotic instructions were given. Instructions were given to call the office immediately for bloody urine, difficulty urinating, urinary  retention, painful or frequent urination, fever, chills, nausea, vomiting or other illness. The patient stated that she understood these instructions and would comply with them.         Urinalysis w/Scope Dipstick Dipstick Cont'd Micro  Color: Amber Bilirubin: Neg mg/dL WBC/hpf: 6 - 02/RKY  Appearance: Cloudy Ketones: Trace mg/dL RBC/hpf: 40 - 70/WCB  Specific Gravity: 1.025 Blood: 3+ ery/uL Bacteria: NS (Not Seen)  pH: 5.5 Protein: 2+ mg/dL Cystals: NS (Not Seen)  Glucose: Neg mg/dL Urobilinogen: 1.0 mg/dL Casts: NS (Not Seen)    Nitrites: Positive Trichomonas: Not Present    Leukocyte Esterase: 2+ leu/uL Mucous: Present      Epithelial Cells: 6 - 10/hpf      Yeast: NS (Not Seen)      Sperm: Not Present         Ketoralac 60mg  - , [id number] Injection site prepped with alcohol and medication injected IM into the left buttock using standard technique. Pt tolerated well without complications, A band-aid was applied.    Qty: 60 Adm. By: [id number]  Unit: mg Lot No [place] Nunnery  Route: IM Exp. Date 09/19/2021  Freq: None Mfgr.: 09/21/2021  Site: Left Buttock   ASSESSMENT:      ICD-10 Details  1 GU:   Flank Pain - R10.84   2   LLQ pain - R10.32   3   Renal calculus - N20.0    PLAN:           Document Letter(s):  Created for Patient: Clinical Summary         Notes:   The patient's left ureteral stent has migrated up into the proximal ureter. I was unable to remove it today in the office. As such, I recommended that we proceed to the operating room for ureteroscopy, stent removal. Given her discomfort, we will try to get this done today. At the time like to repeat her retrograde pyelogram to ensure that her collecting system is healed and normal from her surgery. I went over all this with the patient, and she is opted to proceed.

## 2021-01-11 NOTE — Anesthesia Postprocedure Evaluation (Signed)
Anesthesia Post Note  Patient: [name]  Procedure(s) Performed: CYSTOSCOPY WITH RETROGRADE PYELOGRAM, URETEROSCOPY AND STENT REPLACEMENT (Left: Renal)     Patient location during evaluation: PACU Anesthesia Type: General Level of consciousness: awake and alert and oriented Pain management: pain level controlled Vital Signs Assessment: post-procedure vital signs reviewed and stable Respiratory status: spontaneous breathing, nonlabored ventilation and respiratory function stable Cardiovascular status: blood pressure returned to baseline and stable Postop Assessment: no apparent nausea or vomiting Anesthetic complications: no   No notable events documented.  Last Vitals:  Vitals:   01/11/21 1506 01/11/21 1515  BP:    Pulse: 83 83  Resp: 14 14  Temp: 36.5 C   SpO2: 97% 96%    Last Pain:  Vitals:   01/11/21 1515  TempSrc:   PainSc: 0-No pain                 , A.

## 2021-01-11 NOTE — Interval H&P Note (Signed)
History and Physical Interval Note:  01/11/2021 2:17 PM  [name]  has presented today for surgery, with the diagnosis of retained left ureteral stent.  The various methods of treatment have been discussed with the patient and family. After consideration of risks, benefits and other options for treatment, the patient has consented to  Procedure(s) with comments: CYSTOSCOPY WITH RETROGRADE PYELOGRAM, URETEROSCOPY AND STENT PLACEMENT (Left) - left ureteroscopy, stent removal , left retrograde pyelogram and possible stent placement as a surgical intervention.  The patient's history has been reviewed, patient examined, no change in status, stable for surgery.  I have reviewed the patient's chart and labs.  Questions were answered to the patient's satisfaction.     Crist Fat

## 2021-01-11 NOTE — Transfer of Care (Signed)
Immediate Anesthesia Transfer of Care Note  Patient: [name]  Procedure(s) Performed: CYSTOSCOPY WITH RETROGRADE PYELOGRAM, URETEROSCOPY AND STENT REPLACEMENT (Left: Renal)  Patient Location: PACU  Anesthesia Type:General  Level of Consciousness: awake and patient cooperative  Airway & Oxygen Therapy: Patient Spontanous Breathing and Patient connected to nasal cannula oxygen  Post-op Assessment: Report given to RN and Post -op Vital signs reviewed and stable  Post vital signs: Reviewed and stable  Last Vitals:  Vitals Value Taken Time  BP    Temp    Pulse    Resp    SpO2      Last Pain:  Vitals:   01/11/21 1109  TempSrc: Oral  PainSc: 2       Patients Stated Pain Goal: 2 (01/11/21 1109)  Complications: No notable events documented.

## 2021-01-11 NOTE — Anesthesia Procedure Notes (Signed)
Procedure Name: LMA Insertion Date/Time: 01/11/2021 2:28 PM Performed by: Earmon Phoenix, CRNA Pre-anesthesia Checklist: Patient identified, Emergency Drugs available, Suction available, Patient being monitored and Timeout performed Patient Re-evaluated:Patient Re-evaluated prior to induction Oxygen Delivery Method: Circle system utilized Preoxygenation: Pre-oxygenation with 100% oxygen Induction Type: IV induction Ventilation: Mask ventilation without difficulty LMA Size: 4.0 Number of attempts: 1 Placement Confirmation: positive ETCO2, CO2 detector and breath sounds checked- equal and bilateral Tube secured with: Tape Dental Injury: Teeth and Oropharynx as per pre-operative assessment

## 2021-01-12 ENCOUNTER — Encounter (HOSPITAL_BASED_OUTPATIENT_CLINIC_OR_DEPARTMENT_OTHER): Payer: Self-pay | Admitting: Urology

## 2021-07-20 ENCOUNTER — Other Ambulatory Visit: Payer: Self-pay | Admitting: Family Medicine

## 2021-07-20 DIAGNOSIS — Z1231 Encounter for screening mammogram for malignant neoplasm of breast: Secondary | ICD-10-CM

## 2021-07-30 ENCOUNTER — Ambulatory Visit
Admission: RE | Admit: 2021-07-30 | Discharge: 2021-07-30 | Disposition: A | Discharge status: Home / Self Care | Payer: 59 | Source: Ambulatory Visit | Attending: Family Medicine | Admitting: Family Medicine

## 2021-07-30 DIAGNOSIS — Z1231 Encounter for screening mammogram for malignant neoplasm of breast: Secondary | ICD-10-CM

## 2022-02-03 ENCOUNTER — Other Ambulatory Visit (HOSPITAL_COMMUNITY)
Admission: RE | Admit: 2022-02-03 | Discharge: 2022-02-03 | Disposition: A | Discharge status: Home / Self Care | Payer: 59 | Source: Ambulatory Visit | Attending: Nurse Practitioner | Admitting: Nurse Practitioner

## 2022-02-03 ENCOUNTER — Other Ambulatory Visit: Payer: Self-pay | Admitting: Nurse Practitioner

## 2022-02-03 DIAGNOSIS — Z87410 Personal history of cervical dysplasia: Secondary | ICD-10-CM | POA: Diagnosis present

## 2022-02-03 DIAGNOSIS — R87810 Cervical high risk human papillomavirus (HPV) DNA test positive: Secondary | ICD-10-CM | POA: Insufficient documentation

## 2022-02-09 LAB — CYTOLOGY - PAP
Comment: NEGATIVE
Diagnosis: UNDETERMINED — AB
High risk HPV: NEGATIVE

## 2022-09-08 ENCOUNTER — Other Ambulatory Visit: Payer: Self-pay | Admitting: Internal Medicine

## 2022-09-08 DIAGNOSIS — Z1231 Encounter for screening mammogram for malignant neoplasm of breast: Secondary | ICD-10-CM

## 2022-09-13 ENCOUNTER — Ambulatory Visit: Admission: RE | Admit: 2022-09-13 | Payer: 59 | Source: Ambulatory Visit

## 2022-09-13 DIAGNOSIS — Z1231 Encounter for screening mammogram for malignant neoplasm of breast: Secondary | ICD-10-CM

## 2022-09-15 ENCOUNTER — Other Ambulatory Visit: Payer: Self-pay | Admitting: Internal Medicine

## 2022-09-15 DIAGNOSIS — R928 Other abnormal and inconclusive findings on diagnostic imaging of breast: Secondary | ICD-10-CM

## 2022-09-19 ENCOUNTER — Other Ambulatory Visit: Payer: Self-pay | Admitting: Internal Medicine

## 2022-09-19 ENCOUNTER — Ambulatory Visit
Admission: RE | Admit: 2022-09-19 | Discharge: 2022-09-19 | Disposition: A | Discharge status: Home / Self Care | Payer: 59 | Source: Ambulatory Visit | Attending: Internal Medicine | Admitting: Internal Medicine

## 2022-09-19 DIAGNOSIS — R928 Other abnormal and inconclusive findings on diagnostic imaging of breast: Secondary | ICD-10-CM

## 2022-09-19 DIAGNOSIS — R921 Mammographic calcification found on diagnostic imaging of breast: Secondary | ICD-10-CM

## 2022-09-21 ENCOUNTER — Ambulatory Visit: Admission: RE | Admit: 2022-09-21 | Payer: 59 | Source: Ambulatory Visit

## 2022-09-21 ENCOUNTER — Ambulatory Visit
Admission: RE | Admit: 2022-09-21 | Discharge: 2022-09-21 | Disposition: A | Discharge status: Home / Self Care | Payer: 59 | Source: Ambulatory Visit | Attending: Internal Medicine | Admitting: Internal Medicine

## 2022-09-21 DIAGNOSIS — R921 Mammographic calcification found on diagnostic imaging of breast: Secondary | ICD-10-CM

## 2022-09-21 HISTORY — PX: BREAST BIOPSY: SHX20

## 2022-09-22 ENCOUNTER — Other Ambulatory Visit: Payer: Self-pay | Admitting: Internal Medicine

## 2022-09-22 DIAGNOSIS — C801 Malignant (primary) neoplasm, unspecified: Secondary | ICD-10-CM

## 2022-09-22 DIAGNOSIS — C50919 Malignant neoplasm of unspecified site of unspecified female breast: Secondary | ICD-10-CM

## 2022-09-22 DIAGNOSIS — Z923 Personal history of irradiation: Secondary | ICD-10-CM

## 2022-09-22 HISTORY — DX: Personal history of irradiation: Z92.3

## 2022-09-22 HISTORY — DX: Malignant neoplasm of unspecified site of unspecified female breast: C50.919

## 2022-09-28 ENCOUNTER — Other Ambulatory Visit: Payer: 59

## 2022-10-03 NOTE — Progress Notes (Signed)
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 Dwain Sarna on 09-30-22      Past/Anticipated interventions by medical oncology, if any: Pt to see Dr. Ellin Saba on 11-08-22   Lymphedema issues, if any:  not applicable  Pain issues, if any:  no   SAFETY ISSUES: Prior radiation? no Pacemaker/ICD? no Possible current pregnancy?no Is the patient on methotrexate? no  Current Complaints / other details:  No major concerns or questions at this time. Waiting on genetics results. Pt having surgery on 10-18-22. The genetic testing will specify what surgery she will have.

## 2022-10-04 ENCOUNTER — Other Ambulatory Visit: Payer: Self-pay | Admitting: General Surgery

## 2022-10-04 DIAGNOSIS — D0512 Intraductal carcinoma in situ of left breast: Secondary | ICD-10-CM

## 2022-10-07 ENCOUNTER — Encounter: Payer: Self-pay | Admitting: Radiation Oncology

## 2022-10-07 ENCOUNTER — Telehealth: Payer: Self-pay

## 2022-10-07 NOTE — Telephone Encounter (Signed)
 Rn called pt for meaningful use and nurse evaluation information. Consult note completed and routed to Dr. Basilio Cairo.

## 2022-10-09 NOTE — Progress Notes (Signed)
Radiation Oncology         [phone] ________________________________  Initial Outpatient Consultation  Name: [name] MRN: [id number]  Date: 10/10/2022  DOB: [birth year]  WU:[id number], Consuella Lose, MD (Inactive)  Emelia Loron, MD   REFERRING PHYSICIAN: Emelia Loron, MD  DIAGNOSIS:    ICD-10-CM   1. Ductal carcinoma in situ (DCIS) of left breast  D05.12        Cancer Staging  No matching staging information was found for the patient.  Stage 0 (cTis (DCIS), cN0, cM0) Left Breast UOQ, Intermediate grade DCIS, ER+ / PR+ / Her2 not assessed   CHIEF COMPLAINT: Here to discuss management of left breast cancer  HISTORY OF PRESENT ILLNESS::[name] is a 57 y.o. female who presented with a left breast abnormality on the following imaging: bilateral screening mammogram on the date of 09/13/22. No symptoms, if any, were reported at that time. Left breast diagnostic mammogram on 09/19/22 further revealed indeterminate calcifications in the outer left breast spanning 7 mm.   Biopsy of the upper outer left breast on date of 09/21/22 showed intermediate grade DCIS measuring 5 mm in the greatest linear extent of the sample with a background of fibrocystic changes including adenosis.  ER status: 100% positive and PR status 50% positive, both with strong staining intensity Her2 not assessed.   She was accordingly referred to Dr. Dwain Sarna and she has opted to proceed with breast conserving surgery. Her procedure is currently scheduled for 10/18/22.   The patient has a family history of breast cancer in her paternal grandmother who passed away at 43. She also has a history of pancreatic cancer in her mother who passed away at the age of 67. In light of her significant family history, a referral has been placed to genetics for testing.   ***  PREVIOUS RADIATION THERAPY: No  PAST MEDICAL HISTORY:  has a past medical history of History of kidney stones, Mass of finger of right  hand, PONV (postoperative nausea and vomiting), and Wears glasses.    PAST SURGICAL HISTORY: Past Surgical History:  Procedure Laterality Date   BREAST BIOPSY Left 09/21/2022   MM LT BREAST BX W LOC DEV 1ST LESION IMAGE BX SPEC STEREO GUIDE 09/21/2022 [facility]   BREAST EXCISIONAL BIOPSY Right    right axilla   CERVICAL BIOPSY  W/ LOOP ELECTRODE EXCISION  age 27   CYSTOSCOPY WITH RETROGRADE PYELOGRAM, URETEROSCOPY AND STENT PLACEMENT Left 01/11/2021   Procedure: CYSTOSCOPY WITH RETROGRADE PYELOGRAM, URETEROSCOPY AND STENT REPLACEMENT;  Surgeon: Crist Fat, MD;  [facility];  Service: Urology;  Laterality: Left;   EXCISION AXILLARY MASS Right 07-20-1998   BENIGN   EXTRACORPOREAL SHOCK WAVE LITHOTRIPSY  2008   INJECTION KNEE  01/06/2012   Procedure: KNEE INJECTION;  Surgeon: Vickki Hearing, MD;  Location: [facility];  Service: Orthopedics;  Laterality: Right;   KNEE ARTHROSCOPY  01/06/2012   Procedure: ARTHROSCOPY KNEE;  Surgeon: Vickki Hearing, MD;  Location: [facility];  Service: Orthopedics;  Laterality: Right;   LAPAROSCOPIC CHOLECYSTECTOMY  2007   LAPAROSCOPY/  LASER ABLATION ENDOMETRIOSIS/  BILATERAL TUBAL LIGATION WITH FILSHIE CLIPS  01-12-2004   LEFT GREAT TOE EXCISION BONE SPURS  Nov  2015   MASS EXCISION Right 10/30/2014   Procedure: RIGHT THUMB DEEP MASS EXCISION ;  Surgeon: Bradly Bienenstock, MD;  Location: [facility];  Service: Orthopedics;  Laterality: Right;   SHOULDER ARTHROSCOPY WITH ROTATOR CUFF REPAIR AND SUBACROMIAL DECOMPRESSION Right 01-08-2009  w/ Debridement labral tear/  DCR/  Acromioplasty    FAMILY HISTORY: family history includes Breast cancer in her paternal grandmother; Cancer in an other family member.  SOCIAL HISTORY:  reports that she quit smoking about 28 years ago. Her smoking use included cigarettes. She started smoking about 38 years ago. She has a 1.2 pack-year smoking history. She has never used  smokeless tobacco. She reports current alcohol use. She reports that she does not use drugs.  ALLERGIES: Levaquin [levofloxacin], Penicillins, and Sulfa antibiotics  MEDICATIONS:  Current Outpatient Medications  Medication Sig Dispense Refill   Calcium Carbonate (CALCIUM 500 PO) Take by mouth.     cetirizine (ZYRTEC) 10 MG tablet Take 10 mg by mouth daily.     cycloSPORINE (VEVYE) 0.1 % SOLN Apply 1 drop to eye daily.     escitalopram (LEXAPRO) 10 MG tablet      Glucosamine 750 MG TABS Take by mouth.     ibuprofen (ADVIL) 800 MG tablet Take 1 tablet (800 mg total) by mouth 3 (three) times daily. 21 tablet 0   Multiple Vitamin (MULTIVITAMIN) capsule Take 1 capsule by mouth daily.     pantoprazole (PROTONIX) 40 MG tablet      hydrocortisone (ANUSOL-HC) 25 MG suppository Place 25 mg rectally at bedtime. (Patient not taking: Reported on 10/07/2022)     metroNIDAZOLE (METROCREAM) 0.75 % cream APPLY TO AFFECTED AREASATWICE DAILY. (Patient not taking: Reported on 10/07/2022)     ondansetron (ZOFRAN ODT) 4 MG disintegrating tablet Take 1 tablet (4 mg total) by mouth every 8 (eight) hours as needed for nausea. (Patient not taking: Reported on 10/07/2022) 10 tablet 0   oxyCODONE-acetaminophen (PERCOCET) 5-325 MG tablet Take 1 tablet by mouth every 6 (six) hours as needed. (Patient not taking: Reported on 10/07/2022) 10 tablet 0   valACYclovir (VALTREX) 1000 MG tablet SMARTSIG:1 Tablet(s) By Mouth Every 12 Hours (Patient not taking: Reported on 10/07/2022)     valACYclovir (VALTREX) 500 MG tablet  (Patient not taking: Reported on 10/07/2022)     No current facility-administered medications for this encounter.    REVIEW OF SYSTEMS: As above in HPI.   PHYSICAL EXAM:  vitals were not taken for this visit.   General: Alert and oriented, in no acute distress HEENT: Head is normocephalic. Extraocular movements are intact. Oropharynx is clear. Neck: Neck is supple, no palpable cervical or supraclavicular  lymphadenopathy. Heart: Regular in rate and rhythm with no murmurs, rubs, or gallops. Chest: Clear to auscultation bilaterally, with no rhonchi, wheezes, or rales. Abdomen: Soft, nontender, nondistended, with no rigidity or guarding. Extremities: No cyanosis or edema. Lymphatics: see Neck Exam Skin: No concerning lesions. Musculoskeletal: symmetric strength and muscle tone throughout. Neurologic: Cranial nerves II through XII are grossly intact. No obvious focalities. Speech is fluent. Coordination is intact. Psychiatric: Judgment and insight are intact. Affect is appropriate. Breasts: *** . No other palpable masses appreciated in the breasts or axillae *** .    ECOG = ***  0 - Asymptomatic (Fully active, able to carry on all predisease activities without restriction)  1 - Symptomatic but completely ambulatory (Restricted in physically strenuous activity but ambulatory and able to carry out work of a light or sedentary nature. For example, light housework, office work)  2 - Symptomatic, <50% in bed during the day (Ambulatory and capable of all self care but unable to carry out any work activities. Up and about more than 50% of waking hours)  3 - Symptomatic, >50% in bed, but  not bedbound (Capable of only limited self-care, confined to bed or chair 50% or more of waking hours)  4 - Bedbound (Completely disabled. Cannot carry on any self-care. Totally confined to bed or chair)  5 - Death   Santiago Glad MM, Creech RH, Tormey DC, et al. [phone]). "Toxicity and response criteria of the [facility] Group". Am. Evlyn Clines. Oncol. 5 (6): 649-55   LABORATORY DATA:  Lab Results  Component Value Date   WBC 8.9 12/28/2020   HGB 13.9 01/11/2021   HCT 41.0 01/11/2021   MCV 91.8 12/28/2020   PLT 299 12/28/2020   CMP     Component Value Date/Time   NA 139 01/11/2021 1129   K 3.6 01/11/2021 1129   CL 104 01/11/2021 1129   CO2 22 12/28/2020 2035   GLUCOSE 94 01/11/2021 1129   BUN 12  01/11/2021 1129   CREATININE 0.70 01/11/2021 1129   CALCIUM 9.6 12/28/2020 2035   GFRNONAA >60 12/28/2020 2035   GFRAA  01/08/2007 1009    >60        The eGFR has been calculated using the MDRD equation. This calculation has not been validated in all clinical         RADIOGRAPHY: MM LT BREAST BX W LOC DEV 1ST LESION IMAGE BX SPEC STEREO GUIDE  Addendum Date: 09/23/2022   ADDENDUM REPORT: 09/23/2022 08:13 ADDENDUM: PATHOLOGY revealed: Site Breast, LEFT, needle core biopsy, UOQ DUCTAL CARCINOMA IN SITU, INTERMEDIATE NUCLEAR GRADE, SOLID AND CRIBRIFORM TYPES WITHOUT NECROSIS -NEGATIVE FOR INVASIVE CARCINOMA -MICROCALCIFICATIONS PRESENT WITHIN DCIS -DCIS MEASURES 5 MM IN GREATEST LINEAR EXTENT -BACKGROUND FIBROCYSTIC CHANGES INCLUDING ADENOSIS Pathology results are CONCORDANT with imaging findings, per Baird Lyons M.D. Pathology results and recommendations below were discussed with patient by telephone on 09/22/2022. Patient reported biopsy site with slight tenderness at the site. Post biopsy care instructions were reviewed, questions were answered and my direct phone number was provided to patient. Patient was instructed to call the [facility] if any concerns or questions arise related to the biopsy. RECOMMENDATION: Surgical consultation has been arranged for patient to see Dr. Dwain Sarna at [facility] on 09/30/2022. There is no ultrasound of the left axilla. A secure email was sent to [facility] to schedule prior to appointment on 09/30/2022. Pathology results reported by Lynett Grimes, RN on 09/22/2022. Electronically Signed   By: Baird Lyons M.D.   On: 09/23/2022 08:13   Result Date: 09/23/2022 CLINICAL DATA:  Left breast calcifications. EXAM: LEFT BREAST STEREOTACTIC CORE NEEDLE BIOPSY COMPARISON:  Previous exam(s). FINDINGS: The patient and I discussed the procedure of stereotactic-guided biopsy including benefits and alternatives. We discussed  the high likelihood of a successful procedure. We discussed the risks of the procedure including infection, bleeding, tissue injury, clip migration, and inadequate sampling. Informed written consent was given. The usual time out protocol was performed immediately prior to the procedure. Using sterile technique and 1% lidocaine and 1% lidocaine with epinephrine as local anesthetic, under stereotactic guidance, a 9 gauge vacuum assisted device was used to perform core needle biopsy of calcifications in the upper-outer quadrant of the left breast using a lateral to medial approach. Specimen radiograph was performed showing calcifications are present in the tissue samples. Specimens with calcifications are identified for pathology. Lesion quadrant: Upper-outer quadrant At the conclusion of the procedure, X shaped tissue marker clip was deployed into the biopsy cavity. Follow-up 2-view mammogram was performed and dictated separately. IMPRESSION: Stereotactic-guided biopsy of the  left breast. No apparent complications. Electronically Signed: By: Baird Lyons M.D. On: 09/21/2022 10:09   MM CLIP PLACEMENT LEFT  Result Date: 09/21/2022 CLINICAL DATA:  Status post stereotactic biopsy the left breast. EXAM: 3D DIAGNOSTIC LEFT MAMMOGRAM POST STEREOTACTIC BIOPSY COMPARISON:  Previous exam(s). FINDINGS: 3D Mammographic images were obtained following stereotactic guided biopsy of the left breast. The biopsy marking clip is in expected location in the upper-outer quadrant of the left breast. IMPRESSION: Appropriate positioning of the X shaped biopsy marking clip at the site of biopsy in the upper-outer quadrant of the left breast. Final Assessment: Post Procedure Mammograms for Marker Placement Electronically Signed   By: Baird Lyons M.D.   On: 09/21/2022 10:18   MM Digital Diagnostic Unilat L  Result Date: 09/19/2022 CLINICAL DATA:  57 year old female presenting as a recall from screening for possible left breast  calcifications. EXAM: DIGITAL DIAGNOSTIC UNILATERAL LEFT MAMMOGRAM WITH CAD TECHNIQUE: Left digital diagnostic mammography was performed. COMPARISON:  Previous exam(s). ACR Breast Density Category b: There are scattered areas of fibroglandular density. FINDINGS: Spot 2D magnification views and full true lateral tomosynthesis views of the left breast were performed demonstrating persistence of a group of microcalcifications in a linear distribution spanning 0.7 cm in the outer left breast. No associated mass or distortion. IMPRESSION: Indeterminate calcifications spanning 0.7 cm in the outer left breast. RECOMMENDATION: Stereotactic core needle biopsy x1 of the left breast. I have discussed the findings and recommendations with the patient. If applicable, a reminder letter will be sent to the patient regarding the next appointment. BI-RADS CATEGORY  4: Suspicious. Electronically Signed   By: Emmaline Kluver M.D.   On: 09/19/2022 12:19   MM 3D SCREENING MAMMOGRAM BILATERAL BREAST  Result Date: 09/15/2022 CLINICAL DATA:  Screening. EXAM: DIGITAL SCREENING BILATERAL MAMMOGRAM WITH TOMOSYNTHESIS AND CAD TECHNIQUE: Bilateral screening digital craniocaudal and mediolateral oblique mammograms were obtained. Bilateral screening digital breast tomosynthesis was performed. The images were evaluated with computer-aided detection. COMPARISON:  Previous exam(s). ACR Breast Density Category a: The breasts are almost entirely fatty. FINDINGS: In the left breast, calcifications warrant further evaluation. In the right breast, no findings suspicious for malignancy. IMPRESSION: Further evaluation is suggested for calcifications in the left breast. RECOMMENDATION: Diagnostic mammogram of the left breast. (Code:[id number]) The patient will be contacted regarding the findings, and additional imaging will be scheduled. BI-RADS CATEGORY  0: Incomplete: Need additional imaging evaluation. Electronically Signed   By: Bary Richard M.D.    On: 09/15/2022 10:21      IMPRESSION/PLAN: ***   It was a pleasure meeting the patient today. We discussed the risks, benefits, and side effects of radiotherapy. I recommend radiotherapy to the *** to reduce her risk of locoregional recurrence by 2/3.  We discussed that radiation would take approximately *** weeks to complete and that I would give the patient a few weeks to heal following surgery before starting treatment planning. *** If chemotherapy were to be given, this would precede radiotherapy. We spoke about acute effects including skin irritation and fatigue as well as much less common late effects including internal organ injury or irritation. We spoke about the latest technology that is used to minimize the risk of late effects for patients undergoing radiotherapy to the breast or chest wall. No guarantees of treatment were given. The patient is enthusiastic about proceeding with treatment. I look forward to participating in the patient's care.  I will await her referral back to me for postoperative follow-up and eventual CT simulation/treatment planning.  On date of service, in total, I spent *** minutes on this encounter. Patient was seen in person.   __________________________________________   Lonie Peak, MD  This document serves as a record of services personally performed by Lonie Peak, MD. It was created on her behalf by Neena Rhymes, a trained medical scribe. The creation of this record is based on the scribe's personal observations and the provider's statements to them. This document has been checked and approved by the attending provider.

## 2022-10-10 ENCOUNTER — Other Ambulatory Visit: Payer: Self-pay

## 2022-10-10 ENCOUNTER — Ambulatory Visit
Admission: RE | Admit: 2022-10-10 | Discharge: 2022-10-10 | Disposition: A | Discharge status: Home / Self Care | Payer: 59 | Source: Ambulatory Visit | Attending: Radiation Oncology | Admitting: Radiation Oncology

## 2022-10-10 VITALS — BP 135/78 | HR 76 | Temp 97.5°F | Resp 18 | Ht 60.0 in | Wt 174.5 lb

## 2022-10-10 DIAGNOSIS — Z79621 Long term (current) use of calcineurin inhibitor: Secondary | ICD-10-CM | POA: Diagnosis not present

## 2022-10-10 DIAGNOSIS — Z170 Estrogen receptor positive status [ER+]: Secondary | ICD-10-CM | POA: Insufficient documentation

## 2022-10-10 DIAGNOSIS — D0512 Intraductal carcinoma in situ of left breast: Secondary | ICD-10-CM

## 2022-10-10 DIAGNOSIS — Z800 Family history of malignant neoplasm of digestive organs: Secondary | ICD-10-CM | POA: Insufficient documentation

## 2022-10-10 DIAGNOSIS — Z87891 Personal history of nicotine dependence: Secondary | ICD-10-CM | POA: Insufficient documentation

## 2022-10-10 DIAGNOSIS — Z803 Family history of malignant neoplasm of breast: Secondary | ICD-10-CM | POA: Diagnosis not present

## 2022-10-11 ENCOUNTER — Encounter (HOSPITAL_BASED_OUTPATIENT_CLINIC_OR_DEPARTMENT_OTHER): Payer: Self-pay | Admitting: General Surgery

## 2022-10-11 ENCOUNTER — Other Ambulatory Visit: Payer: Self-pay

## 2022-10-11 DIAGNOSIS — D0512 Intraductal carcinoma in situ of left breast: Secondary | ICD-10-CM | POA: Insufficient documentation

## 2022-10-14 ENCOUNTER — Ambulatory Visit
Admission: RE | Admit: 2022-10-14 | Discharge: 2022-10-14 | Disposition: A | Discharge status: Home / Self Care | Payer: 59 | Source: Ambulatory Visit | Attending: General Surgery | Admitting: General Surgery

## 2022-10-14 DIAGNOSIS — D0512 Intraductal carcinoma in situ of left breast: Secondary | ICD-10-CM

## 2022-10-14 HISTORY — PX: BREAST BIOPSY: SHX20

## 2022-10-14 NOTE — Progress Notes (Addendum)
    Enhanced Recovery after Surgery  Enhanced Recovery after Surgery is a protocol used to improve the stress on your body and your recovery after surgery.  Patient Instructions  The night before surgery:  No food after midnight. ONLY clear liquids after midnight  The day of surgery (if you do NOT have diabetes):  Drink ONE (1) Pre-Surgery Clear Ensure as directed.   This drink was given to you during your hospital  pre-op appointment visit. The pre-op nurse will instruct you on the time to drink the  Pre-Surgery Ensure depending on your surgery time. Finish the drink at the designated time by the pre-op nurse.  Nothing else to drink after completing the  Pre-Surgery Clear Ensure.  The day of surgery (if you have diabetes): Drink ONE (1) Gatorade 2 (G2) as directed. This drink was given to you during your hospital  pre-op appointment visit.  The pre-op nurse will instruct you on the time to drink the   Gatorade 2 (G2) depending on your surgery time. Color of the Gatorade may vary. Red is not allowed. Nothing else to drink after completing the  Gatorade 2 (G2).         If you have questions, please contact your surgeon's office.  Pre-surgical soap and instructions also given.

## 2022-10-18 ENCOUNTER — Encounter (HOSPITAL_BASED_OUTPATIENT_CLINIC_OR_DEPARTMENT_OTHER)
Admission: RE | Disposition: A | Discharge status: Home / Self Care | Payer: Self-pay | Source: Home / Self Care | Attending: General Surgery

## 2022-10-18 ENCOUNTER — Ambulatory Visit (HOSPITAL_BASED_OUTPATIENT_CLINIC_OR_DEPARTMENT_OTHER)
Admission: RE | Admit: 2022-10-18 | Discharge: 2022-10-18 | Disposition: A | Discharge status: Home / Self Care | Payer: 59 | Attending: General Surgery | Admitting: General Surgery

## 2022-10-18 ENCOUNTER — Other Ambulatory Visit: Payer: Self-pay

## 2022-10-18 ENCOUNTER — Ambulatory Visit (HOSPITAL_BASED_OUTPATIENT_CLINIC_OR_DEPARTMENT_OTHER): Payer: 59 | Admitting: Certified Registered"

## 2022-10-18 ENCOUNTER — Ambulatory Visit
Admission: RE | Admit: 2022-10-18 | Discharge: 2022-10-18 | Disposition: A | Discharge status: Home / Self Care | Payer: 59 | Source: Ambulatory Visit | Attending: General Surgery | Admitting: General Surgery

## 2022-10-18 ENCOUNTER — Encounter (HOSPITAL_BASED_OUTPATIENT_CLINIC_OR_DEPARTMENT_OTHER): Payer: Self-pay | Admitting: General Surgery

## 2022-10-18 DIAGNOSIS — D0512 Intraductal carcinoma in situ of left breast: Secondary | ICD-10-CM

## 2022-10-18 DIAGNOSIS — C50912 Malignant neoplasm of unspecified site of left female breast: Secondary | ICD-10-CM | POA: Insufficient documentation

## 2022-10-18 DIAGNOSIS — Z800 Family history of malignant neoplasm of digestive organs: Secondary | ICD-10-CM | POA: Diagnosis not present

## 2022-10-18 DIAGNOSIS — R9230 Dense breasts, unspecified: Secondary | ICD-10-CM | POA: Insufficient documentation

## 2022-10-18 DIAGNOSIS — Z87891 Personal history of nicotine dependence: Secondary | ICD-10-CM | POA: Diagnosis not present

## 2022-10-18 DIAGNOSIS — Z803 Family history of malignant neoplasm of breast: Secondary | ICD-10-CM | POA: Diagnosis not present

## 2022-10-18 DIAGNOSIS — Z170 Estrogen receptor positive status [ER+]: Secondary | ICD-10-CM | POA: Diagnosis not present

## 2022-10-18 HISTORY — DX: Gastro-esophageal reflux disease without esophagitis: K21.9

## 2022-10-18 HISTORY — PX: BREAST LUMPECTOMY WITH RADIOACTIVE SEED LOCALIZATION: SHX6424

## 2022-10-18 SURGERY — BREAST LUMPECTOMY WITH RADIOACTIVE SEED LOCALIZATION
Anesthesia: General | Site: Breast | Laterality: Left

## 2022-10-18 MED ORDER — ONDANSETRON HCL 4 MG/2ML IJ SOLN
INTRAMUSCULAR | Status: AC
  Filled 2022-10-18: qty 2

## 2022-10-18 MED ORDER — SCOPOLAMINE 1 MG/3DAYS TD PT72
1.0000 | MEDICATED_PATCH | Freq: Once | TRANSDERMAL | Status: DC
  Administered 2022-10-18: 1.5 mg via TRANSDERMAL

## 2022-10-18 MED ORDER — ENSURE PRE-SURGERY PO LIQD: 296.0000 mL | Freq: Once | ORAL | Status: DC

## 2022-10-18 MED ORDER — OXYCODONE HCL 5 MG PO TABS: 5.0000 mg | ORAL_TABLET | Freq: Once | ORAL | Status: DC | PRN

## 2022-10-18 MED ORDER — PROPOFOL 500 MG/50ML IV EMUL
INTRAVENOUS | Status: DC | PRN
  Administered 2022-10-18: 200 ug/kg/min via INTRAVENOUS

## 2022-10-18 MED ORDER — FENTANYL CITRATE (PF) 100 MCG/2ML IJ SOLN
INTRAMUSCULAR | Status: AC
  Filled 2022-10-18: qty 2

## 2022-10-18 MED ORDER — FENTANYL CITRATE (PF) 100 MCG/2ML IJ SOLN
INTRAMUSCULAR | Status: DC | PRN
  Administered 2022-10-18: 100 ug via INTRAVENOUS

## 2022-10-18 MED ORDER — FENTANYL CITRATE (PF) 100 MCG/2ML IJ SOLN
25.0000 ug | INTRAMUSCULAR | Status: DC | PRN
  Administered 2022-10-18 (×2): 25 ug via INTRAVENOUS

## 2022-10-18 MED ORDER — PROPOFOL 10 MG/ML IV BOLUS
INTRAVENOUS | Status: AC
  Filled 2022-10-18: qty 20

## 2022-10-18 MED ORDER — CEFAZOLIN SODIUM-DEXTROSE 2-3 GM-%(50ML) IV SOLR
INTRAVENOUS | Status: DC | PRN
  Administered 2022-10-18: 2 g via INTRAVENOUS

## 2022-10-18 MED ORDER — PROPOFOL 10 MG/ML IV BOLUS
INTRAVENOUS | Status: DC | PRN
  Administered 2022-10-18: 200 mg via INTRAVENOUS

## 2022-10-18 MED ORDER — ONDANSETRON HCL 4 MG/2ML IJ SOLN
INTRAMUSCULAR | Status: DC | PRN
  Administered 2022-10-18: 4 mg via INTRAVENOUS

## 2022-10-18 MED ORDER — LIDOCAINE 2% (20 MG/ML) 5 ML SYRINGE
INTRAMUSCULAR | Status: AC
  Filled 2022-10-18: qty 5

## 2022-10-18 MED ORDER — ACETAMINOPHEN 500 MG PO TABS
1000.0000 mg | ORAL_TABLET | Freq: Once | ORAL | Status: AC
  Administered 2022-10-18: 1000 mg via ORAL

## 2022-10-18 MED ORDER — DROPERIDOL 2.5 MG/ML IJ SOLN: 0.6250 mg | Freq: Once | INTRAMUSCULAR | Status: DC | PRN

## 2022-10-18 MED ORDER — LACTATED RINGERS IV SOLN: INTRAVENOUS | Status: DC | PRN

## 2022-10-18 MED ORDER — DEXAMETHASONE SODIUM PHOSPHATE 10 MG/ML IJ SOLN
INTRAMUSCULAR | Status: DC | PRN
  Administered 2022-10-18: 5 mg via INTRAVENOUS

## 2022-10-18 MED ORDER — LIDOCAINE HCL (CARDIAC) PF 100 MG/5ML IV SOSY
PREFILLED_SYRINGE | INTRAVENOUS | Status: DC | PRN
  Administered 2022-10-18: 100 mg via INTRAVENOUS

## 2022-10-18 MED ORDER — DEXAMETHASONE SODIUM PHOSPHATE 10 MG/ML IJ SOLN
INTRAMUSCULAR | Status: AC
  Filled 2022-10-18: qty 1

## 2022-10-18 MED ORDER — SCOPOLAMINE 1 MG/3DAYS TD PT72
MEDICATED_PATCH | TRANSDERMAL | Status: AC
  Filled 2022-10-18: qty 1

## 2022-10-18 MED ORDER — ACETAMINOPHEN 500 MG PO TABS
ORAL_TABLET | ORAL | Status: AC
  Filled 2022-10-18: qty 2

## 2022-10-18 MED ORDER — MIDAZOLAM HCL 2 MG/2ML IJ SOLN
INTRAMUSCULAR | Status: DC | PRN
  Administered 2022-10-18: 2 mg via INTRAVENOUS

## 2022-10-18 MED ORDER — LACTATED RINGERS IV SOLN: INTRAVENOUS | Status: DC

## 2022-10-18 MED ORDER — ACETAMINOPHEN 500 MG PO TABS: 1000.0000 mg | ORAL_TABLET | ORAL | Status: AC

## 2022-10-18 MED ORDER — OXYCODONE HCL 5 MG/5ML PO SOLN: 5.0000 mg | Freq: Once | ORAL | Status: DC | PRN

## 2022-10-18 MED ORDER — CHLORHEXIDINE GLUCONATE CLOTH 2 % EX PADS: 6.0000 | MEDICATED_PAD | Freq: Once | CUTANEOUS | Status: DC

## 2022-10-18 MED ORDER — BUPIVACAINE HCL (PF) 0.25 % IJ SOLN
INTRAMUSCULAR | Status: DC | PRN
  Administered 2022-10-18: 10 mL

## 2022-10-18 MED ORDER — MIDAZOLAM HCL 2 MG/2ML IJ SOLN
INTRAMUSCULAR | Status: AC
  Filled 2022-10-18: qty 2

## 2022-10-18 SURGICAL SUPPLY — 55 items
ADH SKN CLS APL DERMABOND .7 (GAUZE/BANDAGES/DRESSINGS) ×1
APL PRP STRL LF DISP 70% ISPRP (MISCELLANEOUS) ×1
APPLIER CLIP 9.375 MED OPEN (MISCELLANEOUS)
APR CLP MED 9.3 20 MLT OPN (MISCELLANEOUS)
BINDER BREAST LRG (GAUZE/BANDAGES/DRESSINGS) IMPLANT
BINDER BREAST MEDIUM (GAUZE/BANDAGES/DRESSINGS) IMPLANT
BINDER BREAST XLRG (GAUZE/BANDAGES/DRESSINGS) IMPLANT
BINDER BREAST XXLRG (GAUZE/BANDAGES/DRESSINGS) IMPLANT
BLADE SURG 15 STRL LF DISP TIS (BLADE) ×2 IMPLANT
BLADE SURG 15 STRL SS (BLADE) ×1
CANISTER SUC SOCK COL 7IN (MISCELLANEOUS) IMPLANT
CANISTER SUCT 1200ML W/VALVE (MISCELLANEOUS) IMPLANT
CHLORAPREP W/TINT 26 (MISCELLANEOUS) ×2 IMPLANT
CLIP APPLIE 9.375 MED OPEN (MISCELLANEOUS) IMPLANT
CLIP TI WIDE RED SMALL 6 (CLIP) IMPLANT
COVER BACK TABLE 60X90IN (DRAPES) ×2 IMPLANT
COVER MAYO STAND STRL (DRAPES) ×2 IMPLANT
COVER PROBE CYLINDRICAL 5X96 (MISCELLANEOUS) ×2 IMPLANT
DERMABOND ADVANCED .7 DNX12 (GAUZE/BANDAGES/DRESSINGS) ×2 IMPLANT
DRAPE LAPAROSCOPIC ABDOMINAL (DRAPES) ×2 IMPLANT
DRAPE UTILITY XL STRL (DRAPES) ×2 IMPLANT
DRSG TEGADERM 4X4.75 (GAUZE/BANDAGES/DRESSINGS) IMPLANT
ELECT COATED BLADE 2.86 ST (ELECTRODE) ×2 IMPLANT
ELECT REM PT RETURN 9FT ADLT (ELECTROSURGICAL) ×1
ELECTRODE REM PT RTRN 9FT ADLT (ELECTROSURGICAL) ×2 IMPLANT
GAUZE SPONGE 4X4 12PLY STRL LF (GAUZE/BANDAGES/DRESSINGS) IMPLANT
GLOVE BIO SURGEON STRL SZ7 (GLOVE) ×4 IMPLANT
GLOVE BIOGEL PI IND STRL 7.5 (GLOVE) ×2 IMPLANT
GOWN STRL REUS W/ TWL LRG LVL3 (GOWN DISPOSABLE) ×4 IMPLANT
GOWN STRL REUS W/TWL LRG LVL3 (GOWN DISPOSABLE) ×2
HEMOSTAT ARISTA ABSORB 3G PWDR (HEMOSTASIS) IMPLANT
KIT MARKER MARGIN INK (KITS) ×2 IMPLANT
NDL HYPO 25X1 1.5 SAFETY (NEEDLE) ×2 IMPLANT
NEEDLE HYPO 25X1 1.5 SAFETY (NEEDLE) ×1
NS IRRIG 1000ML POUR BTL (IV SOLUTION) IMPLANT
PACK BASIN DAY SURGERY FS (CUSTOM PROCEDURE TRAY) ×2 IMPLANT
PENCIL SMOKE EVACUATOR (MISCELLANEOUS) ×2 IMPLANT
RETRACTOR ONETRAX LX 90X20 (MISCELLANEOUS) IMPLANT
SLEEVE SCD COMPRESS KNEE MED (STOCKING) ×2 IMPLANT
SPIKE FLUID TRANSFER (MISCELLANEOUS) IMPLANT
SPONGE T-LAP 4X18 ~~LOC~~+RFID (SPONGE) ×2 IMPLANT
STRIP CLOSURE SKIN 1/2X4 (GAUZE/BANDAGES/DRESSINGS) ×2 IMPLANT
SUT MNCRL AB 4-0 PS2 18 (SUTURE) ×2 IMPLANT
SUT MON AB 5-0 PS2 18 (SUTURE) IMPLANT
SUT SILK 2 0 SH (SUTURE) IMPLANT
SUT VIC AB 2-0 SH 27 (SUTURE) ×1
SUT VIC AB 2-0 SH 27XBRD (SUTURE) ×2 IMPLANT
SUT VIC AB 3-0 SH 27 (SUTURE) ×1
SUT VIC AB 3-0 SH 27X BRD (SUTURE) ×2 IMPLANT
SUT VIC AB 5-0 PS2 18 (SUTURE) IMPLANT
SYR CONTROL 10ML LL (SYRINGE) ×2 IMPLANT
TOWEL GREEN STERILE FF (TOWEL DISPOSABLE) ×2 IMPLANT
TRAY FAXITRON CT DISP (TRAY / TRAY PROCEDURE) ×2 IMPLANT
TUBE CONNECTING 20X1/4 (TUBING) IMPLANT
YANKAUER SUCT BULB TIP NO VENT (SUCTIONS) IMPLANT

## 2022-10-18 NOTE — H&P (Signed)
57 year old female who has a family history of breast cancer in a paternal grandmother is deceased at 49 and a mother who died of pancreatic cancer at age 49. She has no prior breast history. She has a history of anxiety and reflux. She underwent a screening mammogram that shows a density breast tissue. There were some left breast calcifications noted. She underwent additional evaluation as show 7 mm of indeterminate calcs in the left breast. She underwent a biopsy of that with clip placement. 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 She is here to discuss her options.  Review of Systems: A complete review of systems was obtained from the patient. I have reviewed this information and discussed as appropriate with the patient. See HPI as well for other ROS.  Review of Systems  Psychiatric/Behavioral: Positive for depression. The patient is nervous/anxious.  All other systems reviewed and are negative.  Medical History: Past Medical History:  Diagnosis Date  Anxiety  GERD (gastroesophageal reflux disease)  History of cancer   Past Surgical History:  Procedure Laterality Date  Gallstones removed  LAPAROSCOPIC TUBAL LIGATION  TONSILLECTOMY   Allergies  Allergen Reactions  Levofloxacin Other (See Comments)  Severe body aches  Penicillins Unknown and Other (See Comments)  Unknown Childhood reaction  Sulfa (Sulfonamide Antibiotics) Rash and Swelling   Current Outpatient Medications on File Prior to Visit  Medication Sig Dispense Refill  ALPRAZolam (XANAX) 0.25 MG tablet 1 tablet Orally Twice a day as needed for anxiety  calcium carbonate-vitamin D3 500 mg-3.125 mcg (125 unit) per tablet Take by mouth  escitalopram oxalate (LEXAPRO) 20 MG tablet  multivitamin with minerals Cap Take 1 capsule by mouth once daily  pantoprazole (PROTONIX) 40 MG DR tablet    Family History  Problem Relation Age of Onset  Breast cancer Maternal Grandmother     Social History   Tobacco Use  Smoking Status Former  Types: Cigarettes  Start date: 1995  Smokeless Tobacco Never  Marital status: Divorced  Tobacco Use  Smoking status: Former  Types: Cigarettes  Start date: 1995  Smokeless tobacco: Never  Vaping Use  Vaping status: Never Used  Substance and Sexual Activity  Alcohol use: Yes  Drug use: Never   Objective:   Vitals:  09/30/22 1028 09/30/22 1029  BP: (!) 138/98  Pulse: 92  Temp: 36.8 C (98.3 F)  SpO2: 98%  Weight: 79.3 kg (174 lb 12.8 oz)  Height: 152.4 cm (5')  PainSc: 0-No pain  PainLoc: Breast   Body mass index is 34.14 kg/m.  Physical Exam Vitals reviewed.  Constitutional:  Appearance: Normal appearance.  Chest:  Breasts: Left: No inverted nipple, mass or nipple discharge.  Lymphadenopathy:  Upper Body:  Left upper body: No supraclavicular or axillary adenopathy.  Neurological:  Mental Status: She is alert.    Assessment and Plan:   Ductal carcinoma in situ (DCIS) of left breast Left breast seed guided lumpectomy  We discussed the staging and pathophysiology of breast cancer. We discussed all of the different options for treatment for breast cancer including surgery, radiation therapy,and antiestrogen therapy.  We discussed she does not need a sn due to noninvasive nature of this tumor.  We discussed the options for treatment of the breast cancer which included lumpectomy versus a mastectomy. We discussed the performance of the lumpectomy with radioactive seed placement. We discussed a 5-10% chance of a positive margin requiring reexcision in the operating room. We also discussed that she will likely  need radiation therapy if she undergoes lumpectomy. We discussed mastectomy and the postoperative care for that as well. Mastectomy can be followed by reconstruction. Most mastectomy patients will not need radiation therapy. We discussed that there is no difference in her survival whether she undergoes  lumpectomy with radiation therapy or antiestrogen therapy versus a mastectomy. There is also no real difference between her recurrence in the breast.  We discussed the risks of operation including bleeding, infection, possible reoperation. She understands her further therapy will be based on what her stages at the time of her operation.

## 2022-10-18 NOTE — Discharge Instructions (Addendum)
[facility] Office Phone Number [phone]  POST OP INSTRUCTIONS Take 400 mg of ibuprofen every 8 hours or 650 mg tylenol every 6 hours for next 72 hours then as needed. Use ice several times daily also.  A prescription for pain medication may be given to you upon discharge.  Take your pain medication as prescribed, if needed.  If narcotic pain medicine is not needed, then you may take acetaminophen (Tylenol), naprosyn (Alleve) or ibuprofen (Advil) as needed. Take your usually prescribed medications unless otherwise directed If you need a refill on your pain medication, please contact your pharmacy.  They will contact our office to request authorization.  Prescriptions will not be filled after 5pm or on week-ends. You should eat very light the first 24 hours after surgery, such as soup, crackers, pudding, etc.  Resume your normal diet the day after surgery. Most patients will experience some swelling and bruising in the breast.  Ice packs and a good support bra will help.  Wear the breast binder provided or a sports bra for 72 hours day and night.  After that wear a sports bra during the day until you return to the office. Swelling and bruising can take several days to resolve.  It is common to experience some constipation if taking pain medication after surgery.  Increasing fluid intake and taking a stool softener will usually help or prevent this problem from occurring.  A mild laxative (Milk of Magnesia or Miralax) should be taken according to package directions if there are no bowel movements after 48 hours. I used skin glue on the incision, you may shower in 24 hours.  The glue will flake off over the next 2-3 weeks.  Any sutures or staples will be removed at the office during your follow-up visit. ACTIVITIES:  You may resume regular daily activities (gradually increasing) beginning the next day.  Wearing a good support bra or sports bra minimizes pain and swelling.  You may have  sexual intercourse when it is comfortable. You may drive when you no longer are taking prescription pain medication, you can comfortably wear a seatbelt, and you can safely maneuver your car and apply brakes. RETURN TO WORK:  ______________________________________________________________________________________ Bonita Quin should see your doctor in the office for a follow-up appointment approximately two weeks after your surgery.  Your doctor's nurse will typically make your follow-up appointment when she calls you with your pathology report.  Expect your pathology report 3-4 business days after your surgery.  You may call to check if you do not hear from [place] after three days. OTHER INSTRUCTIONS: _______________________________________________________________________________________________ _____________________________________________________________________________________________________________________________________ _____________________________________________________________________________________________________________________________________ _____________________________________________________________________________________________________________________________________  WHEN TO CALL DR WAKEFIELD: Fever over 101.0 Nausea and/or vomiting. Extreme swelling or bruising. Continued bleeding from incision. Increased pain, redness, or drainage from the incision.  The clinic staff is available to answer your questions during regular business hours.  Please don't hesitate to call and ask to speak to one of the nurses for clinical concerns.  If you have a medical emergency, go to the nearest emergency room or call 911.  A surgeon from [facility] is always on call at the hospital.  For further questions, please visit [email or web address]  Post Anesthesia Home Care Instructions  Activity: Get plenty of rest for the remainder of the day. A responsible individual must stay with  you for 24 hours following the procedure.  For the next 24 hours, DO NOT: -Drive a car -Advertising copywriter -Drink alcoholic beverages -Take any medication unless instructed by your  physician -Make any legal decisions or sign important papers.  Meals: Start with liquid foods such as gelatin or soup. Progress to regular foods as tolerated. Avoid greasy, spicy, heavy foods. If nausea and/or vomiting occur, drink only clear liquids until the nausea and/or vomiting subsides. Call your physician if vomiting continues.  Special Instructions/Symptoms: Your throat may feel dry or sore from the anesthesia or the breathing tube placed in your throat during surgery. If this causes discomfort, gargle with warm salt water. The discomfort should disappear within 24 hours.  If you had a scopolamine patch placed behind your ear for the management of post- operative nausea and/or vomiting:  1. The medication in the patch is effective for 72 hours, after which it should be removed.  Wrap patch in a tissue and discard in the trash. Wash hands thoroughly with soap and water. 2. You may remove the patch earlier than 72 hours if you experience unpleasant side effects which may include dry mouth, dizziness or visual disturbances. 3. Avoid touching the patch. Wash your hands with soap and water after contact with the patch.    May have Tylenol after 2:22pm if needed.

## 2022-10-18 NOTE — Anesthesia Procedure Notes (Addendum)
Procedure Name: LMA Insertion Date/Time: 10/18/2022 10:21 AM  Performed by: Karen Kitchens, CRNAPre-anesthesia Checklist: Patient identified, Emergency Drugs available, Suction available and Patient being monitored Patient Re-evaluated:Patient Re-evaluated prior to induction Oxygen Delivery Method: Circle system utilized Preoxygenation: Pre-oxygenation with 100% oxygen Induction Type: IV induction Ventilation: Mask ventilation without difficulty LMA: LMA inserted LMA Size: 4.0 Number of attempts: 1 Airway Equipment and Method: Bite block Placement Confirmation: positive ETCO2, CO2 detector and breath sounds checked- equal and bilateral Tube secured with: Tape Dental Injury: Teeth and Oropharynx as per pre-operative assessment

## 2022-10-18 NOTE — Anesthesia Preprocedure Evaluation (Signed)
Anesthesia Evaluation  Patient identified by MRN, date of birth, ID band Patient awake    Reviewed: Allergy & Precautions, NPO status , Patient's Chart, lab work & pertinent test results  History of Anesthesia Complications (+) PONV and history of anesthetic complications  Airway Mallampati: II  TM Distance: >3 FB Neck ROM: Full    Dental no notable dental hx.    Pulmonary neg pulmonary ROS, former smoker   Pulmonary exam normal        Cardiovascular negative cardio ROS  Rhythm:Regular Rate:Normal     Neuro/Psych negative neurological ROS  negative psych ROS   GI/Hepatic Neg liver ROS,GERD  ,,  Endo/Other  negative endocrine ROS    Renal/GU negative Renal ROS     Musculoskeletal Breast mass   Abdominal Normal abdominal exam  (+)   Peds  Hematology negative hematology ROS (+)   Anesthesia Other Findings   Reproductive/Obstetrics                             Anesthesia Physical Anesthesia Plan  ASA: 2  Anesthesia Plan: General   Post-op Pain Management:    Induction: Intravenous  PONV Risk Score and Plan: 4 or greater and Ondansetron, Dexamethasone, Midazolam, Treatment may vary due to age or medical condition and TIVA  Airway Management Planned: Mask and LMA  Additional Equipment: None  Intra-op Plan:   Post-operative Plan: Extubation in OR  Informed Consent: I have reviewed the patients History and Physical, chart, labs and discussed the procedure including the risks, benefits and alternatives for the proposed anesthesia with the patient or authorized representative who has indicated his/her understanding and acceptance.     Dental advisory given  Plan Discussed with: CRNA  Anesthesia Plan Comments:        Anesthesia Quick Evaluation

## 2022-10-18 NOTE — Transfer of Care (Signed)
Immediate Anesthesia Transfer of Care Note  Patient: [name]  Procedure(s) Performed: LEFT BREAST LUMPECTOMY WITH RADIOACTIVE SEED LOCALIZATION (Left: Breast)  Patient Location: PACU  Anesthesia Type:General  Level of Consciousness: awake, drowsy, and patient cooperative  Airway & Oxygen Therapy: Patient Spontanous Breathing and Patient connected to face mask oxygen  Post-op Assessment: Report given to RN and Post -op Vital signs reviewed and stable  Post vital signs: Reviewed and stable  Last Vitals:  Vitals Value Taken Time  BP    Temp    Pulse 80 10/18/22 1019  Resp 13 10/18/22 1019  SpO2 97 % 10/18/22 1019  Vitals shown include unfiled device data.  Last Pain:  Vitals:   10/18/22 0817  TempSrc: Temporal  PainSc: 0-No pain      Patients Stated Pain Goal: 4 (10/18/22 0817)  Complications: No notable events documented.

## 2022-10-18 NOTE — Op Note (Signed)
Preoperative diagnosis: Left breast DCIS Postoperative diagnosis: Same as above Procedure: Left breast radioactive seed guided lumpectomy Surgical Dr. Harden Mo Anesthesia: General Estimated blood loss: Minimal Specimens: 1.  Left breast tissue marked with paint containing clip, seed was sent confirmed by mammography 2.  Additional inferior and anterior margins marked short superior, long lateral, double deep Complication:none Drains:none Sponge and counts correct completion Disposition recovery stable addition   Indications: 57 year old female who underwent a screening mammogram that shows a density breast tissue. There were some left breast calcifications noted. She underwent additional evaluation as show 7 mm of indeterminate calcs in the left breast. She underwent a biopsy of that with clip placement. 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 We discussed lumpectomy   Procedure: After informed consent was obtained she was taken to the operating room.  She was given antibiotics.  SCDs were placed.  She was placed under general anesthesia without complication.  She was prepped and draped in a sterile sterile surgical fashion.  Surgical timeout was then performed.    I infiltrated Marcaine throughout the lateral breast. I made an incision overlying the seed as it was close to the skin. I dissected using the probe and removed the radioactive seed in the surrounding tissue with an attempt to get a clear margin. The clip and the seed were in the specimen. I thought I might be close to a couple margins so I removed these as above.  I then obtained hemostasis.  I placed a couple clips in the cavity.  I closed the breast tissue down with 2-0 Vicryl.  The skin was closed with 3-0 Vicryl and 4-0 Monocryl.  Glue and Steri-Strips were applied.  She was extubated transferred recovery stable.

## 2022-10-19 ENCOUNTER — Encounter (HOSPITAL_BASED_OUTPATIENT_CLINIC_OR_DEPARTMENT_OTHER): Payer: Self-pay | Admitting: General Surgery

## 2022-10-19 NOTE — Anesthesia Postprocedure Evaluation (Signed)
Anesthesia Post Note  Patient: [name]  Procedure(s) Performed: LEFT BREAST LUMPECTOMY WITH RADIOACTIVE SEED LOCALIZATION (Left: Breast)     Patient location during evaluation: PACU Anesthesia Type: General Level of consciousness: awake and alert Pain management: pain level controlled Vital Signs Assessment: post-procedure vital signs reviewed and stable Respiratory status: spontaneous breathing, nonlabored ventilation, respiratory function stable and patient connected to nasal cannula oxygen Cardiovascular status: blood pressure returned to baseline and stable Postop Assessment: no apparent nausea or vomiting Anesthetic complications: no   No notable events documented.  Last Vitals:  Vitals:   10/18/22 1100 10/18/22 1115  BP: 113/73 115/74  Pulse:  73  Resp:  16  Temp:  (!) 36.2 C  SpO2:  93%    Last Pain:  Vitals:   10/19/22 0835  TempSrc:   PainSc: 4                  [name]

## 2022-10-26 ENCOUNTER — Telehealth: Payer: Self-pay | Admitting: Radiation Oncology

## 2022-10-26 ENCOUNTER — Encounter: Payer: Self-pay | Admitting: *Deleted

## 2022-10-26 DIAGNOSIS — D0512 Intraductal carcinoma in situ of left breast: Secondary | ICD-10-CM

## 2022-10-26 NOTE — Telephone Encounter (Signed)
9/4 @ 12:40 pm Patient called with concerns of why no one has reach out to her about being schedule for her treatments.  She stated is making plans to take classes in [place] and classes start on Nov 7, 8 and would like to have her treatments completed by then.  We have not received any follow up referrals for this patient after her surgery on 8/27.  Reach out to Dr. Doreen Salvage ref coordinator and left voicemail.  Waiting on call back.

## 2022-10-27 LAB — SURGICAL PATHOLOGY

## 2022-10-27 NOTE — Progress Notes (Signed)
Location of Breast Cancer: DCIS left breast  Histology per Pathology Report:  10-18-22 FINAL MICROSCOPIC DIAGNOSIS:  A. BREAST, LEFT, LUMPECTOMY: Focal residual ductal carcinoma in situ, type solid, nuclear grade 2 of 3, without necrosis DCIS greatest dimension: 2 mm Margins:  negative     Closest margin: all >10 mm Prognostic markers: ER Positive, PR positive Biopsy site and clip Other findings: N/A See oncology table  B. BREAST, LEFT ADDITIONAL INFERIOR MARGIN, EXCISION: -  Benign breast tissue, negative for DCIS.  C. BREAST, LEFT ADDITIONAL ANTERIOR MARGIN, EXCISION: -  Benign breast tissue, negative for DCIS (new margin 11 mm in thickness).  ONCOLOGY TABLE:  DCIS OF THE BREAST:  Resection Procedure: Lumpectomy Specimen Laterality: Left Histologic Type: Focal residual ductal carcinoma in situ solid type, nuclear grade 2 of 3 with lobular extension Size of DCIS: 2 mm Nuclear Grade: 2 of 3 Necrosis: Not identified Margins: All margins negative      Specify Closest Margin (required only if <72mm): All margins (in conjunction with A, B and C) greater than 10 mm Regional Lymph Nodes: N/A; no lymph nodes submitted or found      Number of Lymph Nodes Examined: 0      Number of Sentinel Nodes Examined (if applicable): N/A      Number of Lymph Nodes with Macrometastases: N/A      Number of Lymph Nodes with Micrometastases): N/A      Number of Lymph Nodes with Isolated Tumor Cells (=0.2 mm or =200 cells): N/A      Extranodal Extension: N/A Breast Biomarker Testing Performed on Previous Biopsy:      Testing performed on Case Number: [id number]      Estrogen Receptor: Positive, 100% strong      Progesterone Receptor: Positive, 50% strong Pathologic Stage Classification (pTNM, AJCC 8th Edition): pTis, pNX Representative Tumor Block: A4 Comment(s): None (v4.4.0.0)  Receptor Status: ER(100%), PR (50%), Her2-neu (), Ki-67()  Did patient present with symptoms (if so, please  note symptoms) or was this found on screening mammography?: screening mammogram  Past/Anticipated interventions by surgeon, if any: Dr. Dwain Sarna on 09-30-22 Assessment and Plan:   Diagnoses and all orders for this visit:  Ductal carcinoma in situ (DCIS) of left breast  Left breast seed guided lumpectomy Genetic testing  We are going to proceed with genetic testing today. This is due to her family history which makes her a candidate as well as her diagnosis of DCIS. We discussed genetic testing via the salivary route today. We discussed health and life insurance. We also discussed the results of testing including a positive, negative, as well as a variant of uncertain significance and how all those would be manage.  We discussed the staging and pathophysiology of breast cancer. We discussed all of the different options for treatment for breast cancer including surgery, radiation therapy,and antiestrogen therapy.  We discussed she does not need a sn due to noninvasive nature of this tumor.  We discussed the options for treatment of the breast cancer which included lumpectomy versus a mastectomy. We discussed the performance of the lumpectomy with radioactive seed placement. We discussed a 5-10% chance of a positive margin requiring reexcision in the operating room. We also discussed that she will likely need radiation therapy if she undergoes lumpectomy. We discussed mastectomy and the postoperative care for that as well. Mastectomy can be followed by reconstruction. Most mastectomy patients will not need radiation therapy. We discussed that there is no difference in her survival whether  she undergoes lumpectomy with radiation therapy or antiestrogen therapy versus a mastectomy. There is also no real difference between her recurrence in the breast.   10-18-22 Preoperative diagnosis: Left breast DCIS Postoperative diagnosis: Same as above Procedure: Left breast radioactive seed guided  lumpectomy Surgical Dr. Harden Mo  Past/Anticipated interventions by medical oncology, if any: Pt to see Dr. Anders Simmonds on 11-16-22  Lymphedema issues, if any:  no   Pain issues, if any:  yes  reports mild tenderness to left breast.   SAFETY ISSUES: Prior radiation? no Pacemaker/ICD? no Possible current pregnancy?no Is the patient on methotrexate? no  Current Complaints / other details:   Per Dr. Dwain Sarna note on 10-18-22:   57 year old female who has a family history of breast cancer in a paternal grandmother is deceased at 75 and a mother who died of pancreatic cancer at age 46.      BP 127/89   Pulse 74   Temp 98.4 F (36.9 C)   Resp 17   Ht 5' (1.524 m)   Wt 173 lb 9.6 oz (78.7 kg)   LMP 10/17/2014 (Exact Date)   SpO2 99%   BMI 33.90 kg/m

## 2022-11-02 ENCOUNTER — Telehealth: Payer: Self-pay

## 2022-11-02 NOTE — Telephone Encounter (Signed)
RN called pt back to inform her the Covid and flu vaccine are safe for treatment. Pt had left a voicemail questioning if it was safe.

## 2022-11-07 NOTE — Progress Notes (Signed)
Radiation Oncology         [phone] ________________________________  Name: [name] MRN: [id number]  Date: 11/08/2022  DOB: 1965/07/22  Follow-Up Visit Note  Outpatient  CC: Hughie Closs, MD  Emelia Loron, MD  Diagnosis:      ICD-10-CM   1. Ductal carcinoma in situ (DCIS) of left breast [D05.12]  D05.12        Stage 0 (cTis (DCIS), cN0, cM0) Left Breast, Focal residual intermediate grade DCIS, ER+ / PR+ / Her2 not assessed: s/p lumpectomy   CHIEF COMPLAINT: Here to discuss management of left breast cancer  Narrative:  The patient returns today for follow-up.     Since her consultation date of 10/10/22, she opted to proceed with a left breast lumpectomy (without nodal biopsies) on 10/18/22 under the care of Dr. Dwain Sarna. Pathology from the procedure revealed: tumor size of 2 mm; histology of focal residual intermediate grade DCIS without necrosis  carcinoma; all margins negative for DCIS; margin status to in situ disease of >10 mm from all margins; ER status: 100% positive and PR status 50% positive, both with strong staining intensity, Her2 not assessed.   She is scheduled to meet with medical oncology, Dr. Anders Simmonds, at the [facility] cancer center on 11/16/22 to discuss antiestrogen treatment options.   Symptomatically, the patient reports: some left sided breast tenderness that is controlled with Tylenol. She denies any issues with range of motion.         ALLERGIES:  is allergic to levaquin [levofloxacin], penicillins, and sulfa antibiotics.  Meds: Current Outpatient Medications  Medication Sig Dispense Refill   ALPRAZolam (XANAX) 0.25 MG tablet Take 0.25 mg by mouth at bedtime as needed for anxiety.     Calcium Carbonate (CALCIUM 500 PO) Take by mouth.     cetirizine (ZYRTEC) 10 MG tablet Take 10 mg by mouth daily.     cycloSPORINE (VEVYE) 0.1 % SOLN Apply 1 drop to eye daily.     escitalopram (LEXAPRO) 10 MG tablet      Glucosamine 750 MG TABS Take by  mouth.     ibuprofen (ADVIL) 800 MG tablet Take 1 tablet (800 mg total) by mouth 3 (three) times daily. 21 tablet 0   Multiple Vitamin (MULTIVITAMIN) capsule Take 1 capsule by mouth daily.     pantoprazole (PROTONIX) 40 MG tablet      valACYclovir (VALTREX) 1000 MG tablet      valACYclovir (VALTREX) 500 MG tablet      hydrocortisone (ANUSOL-HC) 25 MG suppository Place 25 mg rectally at bedtime. (Patient not taking: Reported on 10/07/2022)     metroNIDAZOLE (METROCREAM) 0.75 % cream APPLY TO AFFECTED AREASATWICE DAILY. (Patient not taking: Reported on 11/08/2022)     ondansetron (ZOFRAN ODT) 4 MG disintegrating tablet Take 1 tablet (4 mg total) by mouth every 8 (eight) hours as needed for nausea. (Patient not taking: Reported on 10/07/2022) 10 tablet 0   oxyCODONE-acetaminophen (PERCOCET) 5-325 MG tablet Take 1 tablet by mouth every 6 (six) hours as needed. (Patient not taking: Reported on 10/07/2022) 10 tablet 0   No current facility-administered medications for this encounter.    Physical Findings:  height is 5' (1.524 m) and weight is 173 lb 9.6 oz (78.7 kg). Her temperature is 98.4 F (36.9 C). Her blood pressure is 127/89 and her pulse is 74. Her respiration is 17 and oxygen saturation is 99%. .     General: Alert and oriented, in no acute distress HEENT: Head is normocephalic.  Extraocular movements are intact. Oropharynx is clear. Neck: Neck is supple, no palpable cervical or supraclavicular lymphadenopathy. Heart: Regular in rate and rhythm with no murmurs, rubs, or gallops. Chest: Clear to auscultation bilaterally, with no rhonchi, wheezes, or rales. Abdomen: Soft, nontender, nondistended, with no rigidity or guarding. Extremities: No cyanosis or edema. Lymphatics: see Neck Exam Musculoskeletal: symmetric strength and muscle tone throughout. Neurologic: No obvious focalities. Speech is fluent.  Psychiatric: Judgment and insight are intact. Affect is appropriate. Breast exam reveals  some erythema to the left breast. Skin edges are well approximated at the lumpectomy incision. No signs of infection.   Lab Findings: Lab Results  Component Value Date   WBC 8.9 12/28/2020   HGB 13.9 01/11/2021   HCT 41.0 01/11/2021   MCV 91.8 12/28/2020   PLT 299 12/28/2020    @LASTCHEMISTRY @  Radiographic Findings: MM Breast Surgical Specimen  Result Date: 10/18/2022 CLINICAL DATA:  Post excision of a left breast lesion following radioactive seed localization. Assess surgical specimen. EXAM: SPECIMEN RADIOGRAPH OF THE LEFT BREAST COMPARISON:  Previous exam(s). FINDINGS: Status post excision of the left breast. The radioactive seed and biopsy marker clip are present, completely intact, and were marked for pathology. IMPRESSION: Specimen radiograph of the left breast. Electronically Signed   By: Amie Portland M.D.   On: 10/18/2022 09:55   MM LT RADIOACTIVE SEED LOC MAMMO GUIDE  Result Date: 10/14/2022 CLINICAL DATA:  57 year old female presenting for localization of the left breast prior to lumpectomy. EXAM: MAMMOGRAPHIC GUIDED RADIOACTIVE SEED LOCALIZATION OF THE LEFT BREAST COMPARISON:  Previous exam(s). FINDINGS: Patient presents for radioactive seed localization prior to left breast lumpectomy. I met with the patient and we discussed the procedure of seed localization including benefits and alternatives. We discussed the high likelihood of a successful procedure. We discussed the risks of the procedure including infection, bleeding, tissue injury and further surgery. We discussed the low dose of radioactivity involved in the procedure. Informed, written consent was given. The usual time-out protocol was performed immediately prior to the procedure. Using mammographic guidance, sterile technique, 1% lidocaine and an I-125 radioactive seed, the X shaped biopsy marking clip in the lateral left breast was localized using a lateral approach. The follow-up mammogram images confirm the seed in the  expected location and were marked for Dr. Dwain Sarna. Follow-up survey of the patient confirms presence of the radioactive seed. Order number of I-125 seed:  [id number]. Total activity:  0.250 millicuries reference Date: 08/22/2022 The patient tolerated the procedure well and was released from the [facility]. She was given instructions regarding seed removal. IMPRESSION: Radioactive seed localization left breast. No apparent complications. Electronically Signed   By: Frederico Hamman M.D.   On: 10/14/2022 14:40    Impression/Plan: Left breast DCIS  Patient is healing well from her lumpectomy and is ready for CT simulation today. We discussed adjuvant radiotherapy today.  I recommend radiation in order to decrease her risk of locoregional recurrence.  I reviewed the logistics, benefits, risks, and potential side effects of this treatment in detail. Risks may include but not necessary be limited to acute and late injury tissue in the radiation fields such as skin irritation (change in color/pigmentation, itching, dryness, pain, peeling). She may experience fatigue. We also discussed possible risk of long term cosmetic changes or scar tissue. There is also a smaller risk for lung toxicity, cardiac toxicity, lymphedema, musculoskeletal changes, rib fragility or induction of a second malignancy, late chronic non-healing soft tissue wound.    The  patient asked good questions which I answered to her satisfaction. She is enthusiastic about proceeding with treatment. A consent form has been signed and placed in her chart.  The patient will receive 40.05 Gy in 15 fractions to the left breast.  This will be followed by a boost of 10 Gy in 5 fractions  On date of service, in total, I spent 30 minutes on this encounter. Patient was seen in person.  _____________________________________   Joyice Faster, PA-C   Lonie Peak, MD  This document serves as a record of services personally performed by Lonie Peak,  MD. It was created on her behalf by Neena Rhymes, a trained medical scribe. The creation of this record is based on the scribe's personal observations and the provider's statements to them. This document has been checked and approved by the attending provider.

## 2022-11-08 ENCOUNTER — Encounter: Payer: Self-pay | Admitting: Radiation Oncology

## 2022-11-08 ENCOUNTER — Ambulatory Visit
Admission: RE | Admit: 2022-11-08 | Discharge: 2022-11-08 | Disposition: A | Discharge status: Home / Self Care | Payer: 59 | Source: Ambulatory Visit | Attending: Radiation Oncology | Admitting: Radiation Oncology

## 2022-11-08 ENCOUNTER — Inpatient Hospital Stay: Payer: 59 | Admitting: Hematology

## 2022-11-08 ENCOUNTER — Other Ambulatory Visit: Payer: Self-pay

## 2022-11-08 VITALS — BP 127/89 | HR 74 | Temp 98.4°F | Resp 17 | Ht 60.0 in | Wt 173.6 lb

## 2022-11-08 DIAGNOSIS — Z79621 Long term (current) use of calcineurin inhibitor: Secondary | ICD-10-CM | POA: Insufficient documentation

## 2022-11-08 DIAGNOSIS — Z170 Estrogen receptor positive status [ER+]: Secondary | ICD-10-CM | POA: Diagnosis not present

## 2022-11-08 DIAGNOSIS — D0512 Intraductal carcinoma in situ of left breast: Secondary | ICD-10-CM | POA: Insufficient documentation

## 2022-11-11 DIAGNOSIS — D0512 Intraductal carcinoma in situ of left breast: Secondary | ICD-10-CM | POA: Diagnosis not present

## 2022-11-16 ENCOUNTER — Inpatient Hospital Stay: Payer: 59 | Attending: Hematology | Admitting: Oncology

## 2022-11-16 ENCOUNTER — Encounter: Payer: Self-pay | Admitting: Oncology

## 2022-11-16 VITALS — BP 134/91 | HR 79 | Resp 18 | Ht 60.0 in | Wt 175.5 lb

## 2022-11-16 DIAGNOSIS — Z79811 Long term (current) use of aromatase inhibitors: Secondary | ICD-10-CM

## 2022-11-16 DIAGNOSIS — Z87891 Personal history of nicotine dependence: Secondary | ICD-10-CM

## 2022-11-16 DIAGNOSIS — Z800 Family history of malignant neoplasm of digestive organs: Secondary | ICD-10-CM | POA: Diagnosis not present

## 2022-11-16 DIAGNOSIS — Z170 Estrogen receptor positive status [ER+]: Secondary | ICD-10-CM

## 2022-11-16 DIAGNOSIS — Z803 Family history of malignant neoplasm of breast: Secondary | ICD-10-CM | POA: Diagnosis not present

## 2022-11-16 DIAGNOSIS — D0512 Intraductal carcinoma in situ of left breast: Secondary | ICD-10-CM

## 2022-11-16 MED ORDER — LETROZOLE 2.5 MG PO TABS: 2.5000 mg | ORAL_TABLET | Freq: Every day | ORAL | 1 refills | Status: DC

## 2022-11-16 NOTE — Assessment & Plan Note (Addendum)
-  Oncology history as above -Recently diagnosed DCIS s/p lumpectomy with clear margins -Establish care with radiation oncology, plan to start radiation soon with Dr. Basilio Cairo -Discussed risk versus benefits of AI treatment in detail for prevention of breast cancer and decreasing the recurrence of DCIS.  Discussed the side effects in detail including hot flashes, arthralgias and decreasing bone density.  Written information was provided.  Patient is agreeable for the treatment.  Will start letrozole 2.5 mg daily to be taken for 5 years -Will obtain a baseline DEXA scan -Recommended to continue taking over-the-counter vitamin D and calcium  Return to clinic in 1 month to evaluate for tolerance and adverse effects

## 2022-11-16 NOTE — Patient Instructions (Addendum)
[facility]  Discharge Instructions  You were seen and examined today by Dr. Anders Simmonds. Dr. Anders Simmonds is a medical oncologist, meaning that he specializes in the treatment of cancer diagnoses. Dr. Anders Simmonds discussed your past medical history, family history of cancers, and the events that led to you being here today.  You were referred to Dr. Anders Simmonds due to a new diagnosis of ductal carcinoma in situ, also called DCIS. This is a Stage 0, or pre cancerous condition. The DCIS was completely removed during surgery, so all care moving forward is aimed at preventing the recurrence of DCIS and invasive breast cancer, which is done in two ways: Radiation Therapy. Radiation to the impacted breast protects the impacted breast from recurrence of DCIS or development of invasive breast cancer and also ensures there is no microscopic DCIS cells remaining. Endocrine Therapy. This is a pill which is taken once daily for at least 5 years which protects your entire body from recurrence of DCIS or development of invasive breast cancer by decreasing the amount of naturally occurring estrogen in your body. Estrogen feeds the DCIS and may breast cancers.  Dr. Anders Simmonds has prescribed Letrozole, also known as Femara, to be taken once daily. Please take it at the same time each day.  Also to establish a baseline, Dr. Anders Simmonds has ordered a bone density test, to see the current strength of your bones as estrogen is important for bone health, but is important to decrease in the setting of DCIS.  Please follow-up as scheduled.  Thank you for choosing [facility] to provide your oncology and hematology care.   To afford each patient quality time with our provider, please arrive at least 15 minutes before your scheduled appointment time. You may need to reschedule your appointment if you arrive late (10 or more minutes). Arriving late affects you and other patients whose  appointments are after yours.  Also, if you miss three or more appointments without notifying the office, you may be dismissed from the clinic at the provider's discretion.    Again, thank you for choosing [facility].  Our hope is that these requests will decrease the amount of time that you wait before being seen by our physicians.   If you have a lab appointment with the Cancer Center - please note that after April 8th, all labs will be drawn in the cancer center.  You do not have to check in or register with the main entrance as you have in the past but will complete your check-in at the cancer center.            _____________________________________________________________  Should you have questions after your visit to [facility], please contact our office at [phone] and follow the prompts.  Our office hours are 8:00 a.m. to 4:30 p.m. Monday - Thursday and 8:00 a.m. to 2:30 p.m. Friday.  Please note that voicemails left after 4:00 p.m. may not be returned until the following business day.  We are closed weekends and all major holidays.  You do have access to a nurse 24-7, just call the main number to the clinic [phone] and do not press any options, hold on the line and a nurse will answer the phone.    For prescription refill requests, have your pharmacy contact our office and allow 72 hours.    Masks are no longer required in the cancer centers. If you would like  for your care team to wear a mask while they are taking care of you, please let them know. You may have one support person who is at least 57 years old accompany you for your appointments.

## 2022-11-16 NOTE — Progress Notes (Addendum)
Hematology-Oncology Clinic Note  Hughie Closs, MD    Reason for Referral: DCIS  Oncology History: I have reviewed her chart and materials related to her cancer extensively and collaborated history with the patient. Summary of oncologic history is as follows: Oncology History  Ductal carcinoma in situ (DCIS) of left breast  09/13/2022 Mammogram   FINDINGS: In the left breast, calcifications warrant further evaluation. In the right breast, no findings suspicious for malignancy.   09/21/2022 Pathology Results   Left breast core needle biopsy:  DCIS, intermediate nuclear grade, solid and cribriform types without necrosis Negative for invasive carcinoma Microcalcifications present within DCIS DCIS measures 5 mm in greatest linear extent   10/11/2022 Initial Diagnosis   Ductal carcinoma in situ (DCIS) of left breast   10/18/2022 Pathology Results   FINAL MICROSCOPIC DIAGNOSIS:   A. BREAST, LEFT, LUMPECTOMY:  Focal residual ductal carcinoma in situ, type solid, nuclear grade 2 of  3, without necrosis  DCIS greatest dimension: 2 mm  Margins:  negative      Closest margin: all >10 mm  Prognostic markers: ER Positive, PR positive  Biopsy site and clip  Other findings: N/A     10/18/2022 Procedure   Left breast lumpectomy      History of Presenting Illness: [name] 57 y.o. female is here referred by Dr. Dwain Sarna for DCIS.  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  Patient has no complaints today and has been doing good.  She reports some breast tenderness from the surgery, no discharge or redness.  Denies fatigue, fever, chills, nausea, vomiting, abdominal pain, weight loss or loss of appetite  She was found to have concerning lesion in her screening mammogram at the end of July, after which a biopsy was done showing DCIS.  She underwent lumpectomy on 10/18/2022 which showed DCIS with clear margins. She was seen by Dr. Basilio Cairo with radiation  oncology and is planned to start radiation soon.  Patient was a former smoker, quit 28 years ago.  Drinks a glass of alcohol every day for the past 2 months.  She has 2 grown children and lives in [place] with a female partner.  Patient reported mother having pancreatic cancer in 54s, grandmother from father side with breast cancer in her 93s.  She does not know the family history of her mother's side as she was adopted.  Patient reported obtaining menopause 7 years ago.  We discussed the diagnosis of DCIS in detail along with prognosis.  Since it is hormone receptor positive, discussed the role of hormone receptor blockade in preventing recurrence and also decreasing the risk of future breast cancer.  Risk versus benefits of letrozole was discussed in detail and written information was provided to the patient.  All the questions and concerns were answered.  Medical History: Past Medical History:  Diagnosis Date   GERD (gastroesophageal reflux disease)    History of kidney stones    Mass of finger of right hand    THUMB   PONV (postoperative nausea and vomiting)    Wears glasses     Surgical history: Past Surgical History:  Procedure Laterality Date   BREAST BIOPSY Left 09/21/2022   MM LT BREAST BX W LOC DEV 1ST LESION IMAGE BX SPEC STEREO GUIDE 09/21/2022 [facility]   BREAST BIOPSY  10/14/2022   MM LT RADIOACTIVE SEED LOC MAMMO GUIDE 10/14/2022 [facility]   BREAST EXCISIONAL BIOPSY Right    right axilla  BREAST LUMPECTOMY WITH RADIOACTIVE SEED LOCALIZATION Left 10/18/2022   Procedure: LEFT BREAST LUMPECTOMY WITH RADIOACTIVE SEED LOCALIZATION;  Surgeon: Emelia Loron, MD;  Location: [facility];  Service: General;  Laterality: Left;  LMA   CERVICAL BIOPSY  W/ LOOP ELECTRODE EXCISION  age 52   CYSTOSCOPY WITH RETROGRADE PYELOGRAM, URETEROSCOPY AND STENT PLACEMENT Left 01/11/2021   Procedure: CYSTOSCOPY WITH RETROGRADE PYELOGRAM, URETEROSCOPY AND STENT  REPLACEMENT;  Surgeon: Crist Fat, MD;  Location: [facility];  Service: Urology;  Laterality: Left;   EXCISION AXILLARY MASS Right 07-20-1998   BENIGN   EXTRACORPOREAL SHOCK WAVE LITHOTRIPSY  2008   INJECTION KNEE  01/06/2012   Procedure: KNEE INJECTION;  Surgeon: Vickki Hearing, MD;  Location: [facility];  Service: Orthopedics;  Laterality: Right;   KNEE ARTHROSCOPY  01/06/2012   Procedure: ARTHROSCOPY KNEE;  Surgeon: Vickki Hearing, MD;  Location: [facility];  Service: Orthopedics;  Laterality: Right;   LAPAROSCOPIC CHOLECYSTECTOMY  2007   LAPAROSCOPY/  LASER ABLATION ENDOMETRIOSIS/  BILATERAL TUBAL LIGATION WITH FILSHIE CLIPS  01-12-2004   LEFT GREAT TOE EXCISION BONE SPURS  Nov  2015   MASS EXCISION Right 10/30/2014   Procedure: RIGHT THUMB DEEP MASS EXCISION ;  Surgeon: Bradly Bienenstock, MD;  Location: [facility];  Service: Orthopedics;  Laterality: Right;   SHOULDER ARTHROSCOPY WITH ROTATOR CUFF REPAIR AND SUBACROMIAL DECOMPRESSION Right 01-08-2009   w/ Debridement labral tear/  DCR/  Acromioplasty    Social History: Social History   Socioeconomic History   Marital status: Divorced    Spouse name: Not on file   Number of children: Not on file   Years of education: 12   Highest education level: Not on file  Occupational History    Employer: Armed forces operational officer  Tobacco Use   Smoking status: Former    Current packs/day: 0.00    Average packs/day: 0.1 packs/day for 10.0 years (1.2 ttl pk-yrs)    Types: Cigarettes    Start date: 10/23/1983    Quit date: 10/22/1993    Years since quitting: 29.0   Smokeless tobacco: Never  Substance and Sexual Activity   Alcohol use: Yes    Comment: occaionsal   Drug use: No   Sexual activity: Not on file  Other Topics Concern   Not on file  Social History Narrative   Not on file   Social Determinants of Health   Financial Resource Strain: Not on file  Food Insecurity: No Food Insecurity (11/08/2022)    Hunger Vital Sign    Worried About Running Out of Food in the Last Year: Never true    Ran Out of Food in the Last Year: Never true  Transportation Needs: No Transportation Needs (11/08/2022)   PRAPARE - Administrator, Civil Service (Medical): No    Lack of Transportation (Non-Medical): No  Physical Activity: Not on file  Stress: Not on file  Social Connections: Not on file  Intimate Partner Violence: Not At Risk (11/08/2022)   Humiliation, Afraid, Rape, and Kick questionnaire    Fear of Current or Ex-Partner: No    Emotionally Abused: No    Physically Abused: No    Sexually Abused: No    Family History: Family History  Problem Relation Age of Onset   Cancer Other    Breast cancer Paternal Grandmother        unsur e of age    Allergies:  is allergic to erythromycin, levofloxacin, penicillins, sulfamethoxazole-trimethoprim, and  sulfa antibiotics.  Medications:  Current Outpatient Medications  Medication Sig Dispense Refill   ALPRAZolam (XANAX) 0.25 MG tablet Take 0.25 mg by mouth at bedtime as needed for anxiety.     Calcium Carbonate (CALCIUM 500 PO) Take by mouth.     cetirizine (ZYRTEC) 10 MG tablet Take 10 mg by mouth daily.     cycloSPORINE (VEVYE) 0.1 % SOLN Apply 1 drop to eye daily.     escitalopram (LEXAPRO) 10 MG tablet      Glucosamine 750 MG TABS Take by mouth.     ibuprofen (ADVIL) 800 MG tablet Take 1 tablet (800 mg total) by mouth 3 (three) times daily. 21 tablet 0   Multiple Vitamin (MULTIVITAMIN) capsule Take 1 capsule by mouth daily.     pantoprazole (PROTONIX) 40 MG tablet      valACYclovir (VALTREX) 1000 MG tablet      valACYclovir (VALTREX) 500 MG tablet      letrozole (FEMARA) 2.5 MG tablet Take 1 tablet (2.5 mg total) by mouth daily. 90 tablet 1   No current facility-administered medications for this visit.    Review of Systems: Constitutional: Denies fevers, chills or abnormal night sweats Eyes: Denies blurriness of vision, double  vision or watery eyes Ears, nose, mouth, throat, and face: Denies mucositis or sore throat Respiratory: Denies cough, dyspnea or wheezes Cardiovascular: Denies palpitation, chest discomfort or lower extremity swelling Gastrointestinal:  Denies nausea, heartburn or change in bowel habits Skin: Denies abnormal skin rashes Lymphatics: Denies new lymphadenopathy or easy bruising Neurological:Denies numbness, tingling or new weaknesses Behavioral/Psych: Mood is stable, no new changes  All other systems were reviewed with the patient and are negative.  Physical Examination: ECOG PERFORMANCE STATUS: 0 - Asymptomatic  Vitals:   11/16/22 1006  BP: (!) 134/91  Pulse: 79  Resp: 18  SpO2: 97%   Filed Weights   11/16/22 1006  Weight: 175 lb 7.8 oz (79.6 kg)    GENERAL:alert, no distress and comfortable SKIN: skin color, texture, turgor are normal, no rashes or significant lesions BREAST: Left: Incision in the outer upper quadrant, tender, no discharge.  Right: No lumps palpated.  No discharge.  Bilateral no axillary lymphadenopathy. (Chaperone:Morgan Edwards, Charity fundraiser) LUNGS: clear to auscultation and percussion with normal breathing effort HEART: regular rate & rhythm and no murmurs and no lower extremity edema ABDOMEN:abdomen soft, non-tender and normal bowel sounds Musculoskeletal:no cyanosis of digits and no clubbing  PSYCH: alert & oriented x 3 with fluent speech  Laboratory Data: I have reviewed the data as listed Lab Results  Component Value Date   WBC 8.9 12/28/2020   HGB 13.9 01/11/2021   HCT 41.0 01/11/2021   MCV 91.8 12/28/2020   PLT 299 12/28/2020     Radiographic Studies: I have personally reviewed the radiological images as listed and agreed with the findings in the report. MM Breast Surgical Specimen  Result Date: 10/18/2022 CLINICAL DATA:  Post excision of a left breast lesion following radioactive seed localization. Assess surgical specimen. EXAM: SPECIMEN RADIOGRAPH  OF THE LEFT BREAST COMPARISON:  Previous exam(s). FINDINGS: Status post excision of the left breast. The radioactive seed and biopsy marker clip are present, completely intact, and were marked for pathology. IMPRESSION: Specimen radiograph of the left breast. Electronically Signed   By: Amie Portland M.D.   On: 10/18/2022 09:55    ASSESSMENT & PLAN:  Patient is a 57 year old female with no significant past medical history referred for DCIS   Ductal carcinoma in situ (DCIS)  of left breast -Oncology history as above -Recently diagnosed DCIS s/p lumpectomy with clear margins -Establish care with radiation oncology, plan to start radiation soon with Dr. Basilio Cairo -Discussed risk versus benefits of AI treatment in detail for prevention of breast cancer and decreasing the recurrence of DCIS.  Discussed the side effects in detail including hot flashes, arthralgias and decreasing bone density.  Written information was provided.  Patient is agreeable for the treatment.  Will start letrozole 2.5 mg daily to be taken for 5 years -Will obtain a baseline DEXA scan -Recommended to continue taking over-the-counter vitamin D and calcium  Return to clinic in 1 month to evaluate for tolerance and adverse effects   Orders Placed This Encounter  Procedures   DG Bone Density    Standing Status:   Future    Standing Expiration Date:   11/16/2023    Order Specific Question:   Reason for Exam (SYMPTOM  OR DIAGNOSIS REQUIRED)    Answer:   AI therapy eval, first bone density    Order Specific Question:   Is patient pregnant?    Answer:   No    Order Specific Question:   Preferred imaging location?    Answer:   [facility]    Order Specific Question:   Release to patient    Answer:   Immediate   Comprehensive metabolic panel    Standing Status:   Future    Standing Expiration Date:   11/16/2023   CBC with Differential    Standing Status:   Future    Standing Expiration Date:   11/16/2023    Order Specific  Question:   Release to patient    Answer:   Immediate [1]    Order Specific Question:   Remote health to draw?    Answer:   No    The total time spent in the appointment was 60 minutes encounter with patients including review of chart and various tests results, discussions about plan of care and coordination of care plan   All questions were answered. The patient knows to call the clinic with any problems, questions or concerns. No barriers to learning was detected.  Cindie Crumbly, MD 9/25/202412:29 PM

## 2022-11-22 ENCOUNTER — Encounter: Payer: Self-pay | Admitting: General Surgery

## 2022-11-23 ENCOUNTER — Other Ambulatory Visit: Payer: Self-pay

## 2022-11-23 ENCOUNTER — Ambulatory Visit
Admission: RE | Admit: 2022-11-23 | Discharge: 2022-11-23 | Disposition: A | Discharge status: Home / Self Care | Payer: 59 | Source: Ambulatory Visit | Attending: Radiation Oncology | Admitting: Radiation Oncology

## 2022-11-23 DIAGNOSIS — D0512 Intraductal carcinoma in situ of left breast: Secondary | ICD-10-CM | POA: Insufficient documentation

## 2022-11-23 LAB — RAD ONC ARIA SESSION SUMMARY
Course Elapsed Days: 0
Plan Fractions Treated to Date: 1
Plan Prescribed Dose Per Fraction: 2.67 Gy
Plan Total Fractions Prescribed: 15
Plan Total Prescribed Dose: 40.05 Gy
Reference Point Dosage Given to Date: 2.67 Gy
Reference Point Session Dosage Given: 2.67 Gy
Session Number: 1

## 2022-11-24 ENCOUNTER — Ambulatory Visit
Admission: RE | Admit: 2022-11-24 | Discharge: 2022-11-24 | Disposition: A | Discharge status: Home / Self Care | Payer: 59 | Source: Ambulatory Visit | Attending: Radiation Oncology | Admitting: Radiation Oncology

## 2022-11-24 ENCOUNTER — Other Ambulatory Visit: Payer: Self-pay

## 2022-11-24 DIAGNOSIS — D0512 Intraductal carcinoma in situ of left breast: Secondary | ICD-10-CM | POA: Diagnosis not present

## 2022-11-24 LAB — RAD ONC ARIA SESSION SUMMARY
Course Elapsed Days: 1
Plan Fractions Treated to Date: 2
Plan Prescribed Dose Per Fraction: 2.67 Gy
Plan Total Fractions Prescribed: 15
Plan Total Prescribed Dose: 40.05 Gy
Reference Point Dosage Given to Date: 5.34 Gy
Reference Point Session Dosage Given: 2.67 Gy
Session Number: 2

## 2022-11-25 ENCOUNTER — Other Ambulatory Visit: Payer: Self-pay

## 2022-11-25 ENCOUNTER — Telehealth: Payer: Self-pay

## 2022-11-25 ENCOUNTER — Ambulatory Visit
Admission: RE | Admit: 2022-11-25 | Discharge: 2022-11-25 | Disposition: A | Discharge status: Home / Self Care | Payer: 59 | Source: Ambulatory Visit | Attending: Radiation Oncology | Admitting: Radiation Oncology

## 2022-11-25 ENCOUNTER — Ambulatory Visit
Admission: RE | Admit: 2022-11-25 | Discharge: 2022-11-25 | Disposition: A | Discharge status: Home / Self Care | Payer: 59 | Source: Ambulatory Visit | Attending: Radiation Oncology

## 2022-11-25 DIAGNOSIS — D0512 Intraductal carcinoma in situ of left breast: Secondary | ICD-10-CM | POA: Diagnosis not present

## 2022-11-25 LAB — RAD ONC ARIA SESSION SUMMARY
Course Elapsed Days: 2
Plan Fractions Treated to Date: 3
Plan Prescribed Dose Per Fraction: 2.67 Gy
Plan Total Fractions Prescribed: 15
Plan Total Prescribed Dose: 40.05 Gy
Reference Point Dosage Given to Date: 8.01 Gy
Reference Point Session Dosage Given: 2.67 Gy
Session Number: 3

## 2022-11-25 MED ORDER — ALRA NON-METALLIC DEODORANT (RAD-ONC)
1.0000 | Freq: Once | TOPICAL | Status: AC
  Administered 2022-11-25: 1 via TOPICAL

## 2022-11-25 MED ORDER — RADIAPLEXRX EX GEL
Freq: Once | CUTANEOUS | Status: AC
  Administered 2022-11-25: 1 via TOPICAL

## 2022-11-28 ENCOUNTER — Telehealth: Payer: Self-pay

## 2022-11-28 ENCOUNTER — Other Ambulatory Visit: Payer: Self-pay

## 2022-11-28 ENCOUNTER — Ambulatory Visit
Admission: RE | Admit: 2022-11-28 | Discharge: 2022-11-28 | Disposition: A | Discharge status: Home / Self Care | Payer: 59 | Source: Ambulatory Visit | Attending: Radiation Oncology

## 2022-11-28 DIAGNOSIS — D0512 Intraductal carcinoma in situ of left breast: Secondary | ICD-10-CM | POA: Diagnosis not present

## 2022-11-28 LAB — RAD ONC ARIA SESSION SUMMARY
Course Elapsed Days: 5
Plan Fractions Treated to Date: 4
Plan Prescribed Dose Per Fraction: 2.67 Gy
Plan Total Fractions Prescribed: 15
Plan Total Prescribed Dose: 40.05 Gy
Reference Point Dosage Given to Date: 10.68 Gy
Reference Point Session Dosage Given: 2.67 Gy
Session Number: 4

## 2022-11-28 NOTE — Progress Notes (Signed)
Pt came around after treatment to speak to RN. Pt reports that she bright red blood in her stool (after being constipated and finally having a BM over the weekend). She also reports having hemorrhoids. Pt then reports she had a bowel movement this am with dark stools. Pt is on medication for acid reflux. Pt denies dizziness or weakness. She was able to ambulate in and out of clinic without difficulty. RN advised pt to call GI MD and also advised pt to start taking Miralax or even prune juice to keep bowels moving well. RN will continue to monitor pt and check on her status later this week to ensure she was able to speak with GI MD.

## 2022-11-28 NOTE — Telephone Encounter (Signed)
RN confirmed with pt she was able to get a hold of her GI MD. Pt stated she was able to and a new Rx was called in for pt for hemorrhoids. She also has an appointment with the GI MD on 12-07-22.

## 2022-11-29 ENCOUNTER — Other Ambulatory Visit: Payer: Self-pay

## 2022-11-29 ENCOUNTER — Ambulatory Visit
Admission: RE | Admit: 2022-11-29 | Discharge: 2022-11-29 | Disposition: A | Discharge status: Home / Self Care | Payer: 59 | Source: Ambulatory Visit | Attending: Radiation Oncology | Admitting: Radiation Oncology

## 2022-11-29 DIAGNOSIS — D0512 Intraductal carcinoma in situ of left breast: Secondary | ICD-10-CM | POA: Diagnosis not present

## 2022-11-29 LAB — RAD ONC ARIA SESSION SUMMARY
Course Elapsed Days: 6
Plan Fractions Treated to Date: 5
Plan Prescribed Dose Per Fraction: 2.67 Gy
Plan Total Fractions Prescribed: 15
Plan Total Prescribed Dose: 40.05 Gy
Reference Point Dosage Given to Date: 13.35 Gy
Reference Point Session Dosage Given: 2.67 Gy
Session Number: 5

## 2022-11-30 ENCOUNTER — Other Ambulatory Visit: Payer: Self-pay

## 2022-11-30 ENCOUNTER — Ambulatory Visit
Admission: RE | Admit: 2022-11-30 | Discharge: 2022-11-30 | Disposition: A | Discharge status: Home / Self Care | Payer: 59 | Source: Ambulatory Visit | Attending: Radiation Oncology | Admitting: Radiation Oncology

## 2022-11-30 DIAGNOSIS — D0512 Intraductal carcinoma in situ of left breast: Secondary | ICD-10-CM | POA: Diagnosis not present

## 2022-11-30 LAB — RAD ONC ARIA SESSION SUMMARY
Course Elapsed Days: 7
Plan Fractions Treated to Date: 6
Plan Prescribed Dose Per Fraction: 2.67 Gy
Plan Total Fractions Prescribed: 15
Plan Total Prescribed Dose: 40.05 Gy
Reference Point Dosage Given to Date: 16.02 Gy
Reference Point Session Dosage Given: 2.67 Gy
Session Number: 6

## 2022-12-01 ENCOUNTER — Ambulatory Visit (HOSPITAL_COMMUNITY)
Admission: RE | Admit: 2022-12-01 | Discharge: 2022-12-01 | Disposition: A | Discharge status: Home / Self Care | Payer: 59 | Source: Ambulatory Visit | Attending: Oncology | Admitting: Oncology

## 2022-12-01 ENCOUNTER — Ambulatory Visit
Admission: RE | Admit: 2022-12-01 | Discharge: 2022-12-01 | Disposition: A | Discharge status: Home / Self Care | Payer: 59 | Source: Ambulatory Visit | Attending: Radiation Oncology | Admitting: Radiation Oncology

## 2022-12-01 ENCOUNTER — Other Ambulatory Visit: Payer: Self-pay

## 2022-12-01 DIAGNOSIS — D0512 Intraductal carcinoma in situ of left breast: Secondary | ICD-10-CM | POA: Insufficient documentation

## 2022-12-01 LAB — RAD ONC ARIA SESSION SUMMARY
Course Elapsed Days: 8
Plan Fractions Treated to Date: 7
Plan Prescribed Dose Per Fraction: 2.67 Gy
Plan Total Fractions Prescribed: 15
Plan Total Prescribed Dose: 40.05 Gy
Reference Point Dosage Given to Date: 18.69 Gy
Reference Point Session Dosage Given: 2.67 Gy
Session Number: 7

## 2022-12-02 ENCOUNTER — Ambulatory Visit
Admission: RE | Admit: 2022-12-02 | Discharge: 2022-12-02 | Disposition: A | Discharge status: Home / Self Care | Payer: 59 | Source: Ambulatory Visit | Attending: Radiation Oncology | Admitting: Radiation Oncology

## 2022-12-02 ENCOUNTER — Other Ambulatory Visit: Payer: Self-pay

## 2022-12-02 DIAGNOSIS — D0512 Intraductal carcinoma in situ of left breast: Secondary | ICD-10-CM | POA: Diagnosis not present

## 2022-12-02 LAB — RAD ONC ARIA SESSION SUMMARY
Course Elapsed Days: 9
Plan Fractions Treated to Date: 8
Plan Prescribed Dose Per Fraction: 2.67 Gy
Plan Total Fractions Prescribed: 15
Plan Total Prescribed Dose: 40.05 Gy
Reference Point Dosage Given to Date: 21.36 Gy
Reference Point Session Dosage Given: 2.67 Gy
Session Number: 8

## 2022-12-05 ENCOUNTER — Ambulatory Visit
Admission: RE | Admit: 2022-12-05 | Discharge: 2022-12-05 | Disposition: A | Discharge status: Home / Self Care | Payer: 59 | Source: Ambulatory Visit | Attending: Radiation Oncology

## 2022-12-05 ENCOUNTER — Ambulatory Visit
Admission: RE | Admit: 2022-12-05 | Discharge: 2022-12-05 | Disposition: A | Discharge status: Home / Self Care | Payer: 59 | Source: Ambulatory Visit | Attending: Radiation Oncology | Admitting: Radiation Oncology

## 2022-12-05 ENCOUNTER — Other Ambulatory Visit: Payer: Self-pay

## 2022-12-05 DIAGNOSIS — D0512 Intraductal carcinoma in situ of left breast: Secondary | ICD-10-CM

## 2022-12-05 LAB — RAD ONC ARIA SESSION SUMMARY
Course Elapsed Days: 12
Plan Fractions Treated to Date: 9
Plan Prescribed Dose Per Fraction: 2.67 Gy
Plan Total Fractions Prescribed: 15
Plan Total Prescribed Dose: 40.05 Gy
Reference Point Dosage Given to Date: 24.03 Gy
Reference Point Session Dosage Given: 2.67 Gy
Session Number: 9

## 2022-12-05 MED ORDER — ALRA NON-METALLIC DEODORANT (RAD-ONC): 1.0000 | Freq: Once | TOPICAL | Status: DC

## 2022-12-06 ENCOUNTER — Other Ambulatory Visit: Payer: Self-pay

## 2022-12-06 ENCOUNTER — Ambulatory Visit
Admission: RE | Admit: 2022-12-06 | Discharge: 2022-12-06 | Disposition: A | Discharge status: Home / Self Care | Payer: 59 | Source: Ambulatory Visit | Attending: Radiation Oncology | Admitting: Radiation Oncology

## 2022-12-06 DIAGNOSIS — D0512 Intraductal carcinoma in situ of left breast: Secondary | ICD-10-CM | POA: Diagnosis not present

## 2022-12-06 LAB — RAD ONC ARIA SESSION SUMMARY
Course Elapsed Days: 13
Plan Fractions Treated to Date: 10
Plan Prescribed Dose Per Fraction: 2.67 Gy
Plan Total Fractions Prescribed: 15
Plan Total Prescribed Dose: 40.05 Gy
Reference Point Dosage Given to Date: 26.7 Gy
Reference Point Session Dosage Given: 2.67 Gy
Session Number: 10

## 2022-12-07 ENCOUNTER — Other Ambulatory Visit: Payer: Self-pay

## 2022-12-07 ENCOUNTER — Ambulatory Visit
Admission: RE | Admit: 2022-12-07 | Discharge: 2022-12-07 | Disposition: A | Discharge status: Home / Self Care | Payer: 59 | Source: Ambulatory Visit | Attending: Radiation Oncology

## 2022-12-07 DIAGNOSIS — D0512 Intraductal carcinoma in situ of left breast: Secondary | ICD-10-CM | POA: Diagnosis not present

## 2022-12-07 LAB — RAD ONC ARIA SESSION SUMMARY
Course Elapsed Days: 14
Plan Fractions Treated to Date: 11
Plan Prescribed Dose Per Fraction: 2.67 Gy
Plan Total Fractions Prescribed: 15
Plan Total Prescribed Dose: 40.05 Gy
Reference Point Dosage Given to Date: 29.37 Gy
Reference Point Session Dosage Given: 2.67 Gy
Session Number: 11

## 2022-12-08 ENCOUNTER — Ambulatory Visit
Admission: RE | Admit: 2022-12-08 | Discharge: 2022-12-08 | Disposition: A | Discharge status: Home / Self Care | Payer: 59 | Source: Ambulatory Visit | Attending: Radiation Oncology | Admitting: Radiation Oncology

## 2022-12-08 ENCOUNTER — Telehealth: Payer: Self-pay

## 2022-12-08 ENCOUNTER — Other Ambulatory Visit: Payer: Self-pay

## 2022-12-08 DIAGNOSIS — D0512 Intraductal carcinoma in situ of left breast: Secondary | ICD-10-CM | POA: Diagnosis not present

## 2022-12-08 LAB — RAD ONC ARIA SESSION SUMMARY
Course Elapsed Days: 15
Plan Fractions Treated to Date: 12
Plan Prescribed Dose Per Fraction: 2.67 Gy
Plan Total Fractions Prescribed: 15
Plan Total Prescribed Dose: 40.05 Gy
Reference Point Dosage Given to Date: 32.04 Gy
Reference Point Session Dosage Given: 2.67 Gy
Session Number: 12

## 2022-12-08 NOTE — Telephone Encounter (Signed)
Pt called RN concerning her skin and specifically mentioned concern for skin breakdown under her breast. She also noted an new area of concern at her arm pit area as well. RN placed PUT for Friday with Dr. Basilio Cairo to see pt and look at her skin.

## 2022-12-09 ENCOUNTER — Ambulatory Visit
Admission: RE | Admit: 2022-12-09 | Discharge: 2022-12-09 | Disposition: A | Discharge status: Home / Self Care | Payer: 59 | Source: Ambulatory Visit | Attending: Radiation Oncology

## 2022-12-09 ENCOUNTER — Ambulatory Visit
Admission: RE | Admit: 2022-12-09 | Discharge: 2022-12-09 | Disposition: A | Discharge status: Home / Self Care | Payer: 59 | Source: Ambulatory Visit | Attending: Radiation Oncology | Admitting: Radiation Oncology

## 2022-12-09 ENCOUNTER — Other Ambulatory Visit: Payer: Self-pay

## 2022-12-09 DIAGNOSIS — D0512 Intraductal carcinoma in situ of left breast: Secondary | ICD-10-CM | POA: Diagnosis not present

## 2022-12-09 LAB — RAD ONC ARIA SESSION SUMMARY
Course Elapsed Days: 16
Plan Fractions Treated to Date: 13
Plan Prescribed Dose Per Fraction: 2.67 Gy
Plan Total Fractions Prescribed: 15
Plan Total Prescribed Dose: 40.05 Gy
Reference Point Dosage Given to Date: 34.71 Gy
Reference Point Session Dosage Given: 2.67 Gy
Session Number: 13

## 2022-12-12 ENCOUNTER — Ambulatory Visit
Admission: RE | Admit: 2022-12-12 | Discharge: 2022-12-12 | Disposition: A | Discharge status: Home / Self Care | Payer: 59 | Source: Ambulatory Visit | Attending: Radiation Oncology

## 2022-12-12 ENCOUNTER — Ambulatory Visit
Admission: RE | Admit: 2022-12-12 | Discharge: 2022-12-12 | Disposition: A | Discharge status: Home / Self Care | Payer: 59 | Source: Ambulatory Visit | Attending: Radiation Oncology | Admitting: Radiation Oncology

## 2022-12-12 ENCOUNTER — Other Ambulatory Visit: Payer: Self-pay

## 2022-12-12 DIAGNOSIS — D0512 Intraductal carcinoma in situ of left breast: Secondary | ICD-10-CM | POA: Diagnosis not present

## 2022-12-12 LAB — RAD ONC ARIA SESSION SUMMARY
Course Elapsed Days: 19
Plan Fractions Treated to Date: 14
Plan Prescribed Dose Per Fraction: 2.67 Gy
Plan Total Fractions Prescribed: 15
Plan Total Prescribed Dose: 40.05 Gy
Reference Point Dosage Given to Date: 37.38 Gy
Reference Point Session Dosage Given: 2.67 Gy
Session Number: 14

## 2022-12-13 ENCOUNTER — Ambulatory Visit
Admission: RE | Admit: 2022-12-13 | Discharge: 2022-12-13 | Disposition: A | Discharge status: Home / Self Care | Payer: 59 | Source: Ambulatory Visit | Attending: Radiation Oncology | Admitting: Radiation Oncology

## 2022-12-13 ENCOUNTER — Other Ambulatory Visit: Payer: Self-pay

## 2022-12-13 DIAGNOSIS — D0512 Intraductal carcinoma in situ of left breast: Secondary | ICD-10-CM | POA: Diagnosis not present

## 2022-12-13 LAB — RAD ONC ARIA SESSION SUMMARY
Course Elapsed Days: 20
Plan Fractions Treated to Date: 15
Plan Prescribed Dose Per Fraction: 2.67 Gy
Plan Total Fractions Prescribed: 15
Plan Total Prescribed Dose: 40.05 Gy
Reference Point Dosage Given to Date: 40.05 Gy
Reference Point Session Dosage Given: 2.67 Gy
Session Number: 15

## 2022-12-14 ENCOUNTER — Ambulatory Visit
Admission: RE | Admit: 2022-12-14 | Discharge: 2022-12-14 | Disposition: A | Discharge status: Home / Self Care | Payer: 59 | Source: Ambulatory Visit | Attending: Radiation Oncology | Admitting: Radiation Oncology

## 2022-12-14 ENCOUNTER — Other Ambulatory Visit: Payer: Self-pay

## 2022-12-14 DIAGNOSIS — D0512 Intraductal carcinoma in situ of left breast: Secondary | ICD-10-CM | POA: Diagnosis not present

## 2022-12-14 LAB — RAD ONC ARIA SESSION SUMMARY
Course Elapsed Days: 21
Plan Fractions Treated to Date: 1
Plan Prescribed Dose Per Fraction: 2 Gy
Plan Total Fractions Prescribed: 5
Plan Total Prescribed Dose: 10 Gy
Reference Point Dosage Given to Date: 2 Gy
Reference Point Session Dosage Given: 2 Gy
Session Number: 16

## 2022-12-15 ENCOUNTER — Ambulatory Visit
Admission: RE | Admit: 2022-12-15 | Discharge: 2022-12-15 | Disposition: A | Discharge status: Home / Self Care | Payer: 59 | Source: Ambulatory Visit | Attending: Radiation Oncology | Admitting: Radiation Oncology

## 2022-12-15 ENCOUNTER — Other Ambulatory Visit: Payer: Self-pay

## 2022-12-15 DIAGNOSIS — D0512 Intraductal carcinoma in situ of left breast: Secondary | ICD-10-CM | POA: Diagnosis not present

## 2022-12-15 LAB — RAD ONC ARIA SESSION SUMMARY
Course Elapsed Days: 22
Plan Fractions Treated to Date: 2
Plan Prescribed Dose Per Fraction: 2 Gy
Plan Total Fractions Prescribed: 5
Plan Total Prescribed Dose: 10 Gy
Reference Point Dosage Given to Date: 4 Gy
Reference Point Session Dosage Given: 2 Gy
Session Number: 17

## 2022-12-16 ENCOUNTER — Other Ambulatory Visit: Payer: Self-pay

## 2022-12-16 ENCOUNTER — Ambulatory Visit
Admission: RE | Admit: 2022-12-16 | Discharge: 2022-12-16 | Disposition: A | Discharge status: Home / Self Care | Payer: 59 | Source: Ambulatory Visit | Attending: Radiation Oncology | Admitting: Radiation Oncology

## 2022-12-16 DIAGNOSIS — D0512 Intraductal carcinoma in situ of left breast: Secondary | ICD-10-CM | POA: Diagnosis not present

## 2022-12-16 LAB — RAD ONC ARIA SESSION SUMMARY
Course Elapsed Days: 23
Plan Fractions Treated to Date: 3
Plan Prescribed Dose Per Fraction: 2 Gy
Plan Total Fractions Prescribed: 5
Plan Total Prescribed Dose: 10 Gy
Reference Point Dosage Given to Date: 6 Gy
Reference Point Session Dosage Given: 2 Gy
Session Number: 18

## 2022-12-19 ENCOUNTER — Ambulatory Visit
Admission: RE | Admit: 2022-12-19 | Discharge: 2022-12-19 | Disposition: A | Discharge status: Home / Self Care | Payer: 59 | Source: Ambulatory Visit | Attending: Radiation Oncology | Admitting: Radiation Oncology

## 2022-12-19 ENCOUNTER — Other Ambulatory Visit: Payer: Self-pay

## 2022-12-19 DIAGNOSIS — D0512 Intraductal carcinoma in situ of left breast: Secondary | ICD-10-CM | POA: Diagnosis not present

## 2022-12-19 LAB — RAD ONC ARIA SESSION SUMMARY
Course Elapsed Days: 26
Plan Fractions Treated to Date: 4
Plan Prescribed Dose Per Fraction: 2 Gy
Plan Total Fractions Prescribed: 5
Plan Total Prescribed Dose: 10 Gy
Reference Point Dosage Given to Date: 8 Gy
Reference Point Session Dosage Given: 2 Gy
Session Number: 19

## 2022-12-20 ENCOUNTER — Ambulatory Visit
Admission: RE | Admit: 2022-12-20 | Discharge: 2022-12-20 | Disposition: A | Discharge status: Home / Self Care | Payer: 59 | Source: Ambulatory Visit | Attending: Radiation Oncology

## 2022-12-20 ENCOUNTER — Other Ambulatory Visit: Payer: Self-pay

## 2022-12-20 DIAGNOSIS — D0512 Intraductal carcinoma in situ of left breast: Secondary | ICD-10-CM | POA: Diagnosis not present

## 2022-12-20 LAB — RAD ONC ARIA SESSION SUMMARY
Course Elapsed Days: 27
Plan Fractions Treated to Date: 5
Plan Prescribed Dose Per Fraction: 2 Gy
Plan Total Fractions Prescribed: 5
Plan Total Prescribed Dose: 10 Gy
Reference Point Dosage Given to Date: 10 Gy
Reference Point Session Dosage Given: 2 Gy
Session Number: 20

## 2022-12-21 ENCOUNTER — Encounter: Payer: Self-pay | Admitting: Oncology

## 2022-12-21 ENCOUNTER — Inpatient Hospital Stay (HOSPITAL_BASED_OUTPATIENT_CLINIC_OR_DEPARTMENT_OTHER): Payer: 59 | Admitting: Oncology

## 2022-12-21 ENCOUNTER — Inpatient Hospital Stay: Payer: 59 | Attending: Oncology

## 2022-12-21 VITALS — BP 137/85 | HR 74 | Temp 98.9°F | Resp 17 | Ht 60.0 in | Wt 174.8 lb

## 2022-12-21 DIAGNOSIS — R7989 Other specified abnormal findings of blood chemistry: Secondary | ICD-10-CM | POA: Insufficient documentation

## 2022-12-21 DIAGNOSIS — M8580 Other specified disorders of bone density and structure, unspecified site: Secondary | ICD-10-CM | POA: Insufficient documentation

## 2022-12-21 DIAGNOSIS — G4700 Insomnia, unspecified: Secondary | ICD-10-CM | POA: Insufficient documentation

## 2022-12-21 DIAGNOSIS — D0512 Intraductal carcinoma in situ of left breast: Secondary | ICD-10-CM | POA: Insufficient documentation

## 2022-12-21 DIAGNOSIS — Z882 Allergy status to sulfonamides status: Secondary | ICD-10-CM | POA: Diagnosis not present

## 2022-12-21 DIAGNOSIS — Z79811 Long term (current) use of aromatase inhibitors: Secondary | ICD-10-CM | POA: Diagnosis not present

## 2022-12-21 DIAGNOSIS — Z923 Personal history of irradiation: Secondary | ICD-10-CM | POA: Diagnosis not present

## 2022-12-21 DIAGNOSIS — F5104 Psychophysiologic insomnia: Secondary | ICD-10-CM | POA: Diagnosis not present

## 2022-12-21 DIAGNOSIS — R110 Nausea: Secondary | ICD-10-CM | POA: Diagnosis not present

## 2022-12-21 DIAGNOSIS — Z880 Allergy status to penicillin: Secondary | ICD-10-CM | POA: Diagnosis not present

## 2022-12-21 DIAGNOSIS — R232 Flushing: Secondary | ICD-10-CM | POA: Diagnosis not present

## 2022-12-21 DIAGNOSIS — Z79621 Long term (current) use of calcineurin inhibitor: Secondary | ICD-10-CM | POA: Diagnosis not present

## 2022-12-21 DIAGNOSIS — Z79899 Other long term (current) drug therapy: Secondary | ICD-10-CM | POA: Diagnosis not present

## 2022-12-21 DIAGNOSIS — M85852 Other specified disorders of bone density and structure, left thigh: Secondary | ICD-10-CM | POA: Diagnosis not present

## 2022-12-21 DIAGNOSIS — Z881 Allergy status to other antibiotic agents status: Secondary | ICD-10-CM | POA: Diagnosis not present

## 2022-12-21 LAB — CBC WITH DIFFERENTIAL/PLATELET
Abs Immature Granulocytes: 0.02 10*3/uL (ref 0.00–0.07)
Basophils Absolute: 0.1 10*3/uL (ref 0.0–0.1)
Basophils Relative: 1 %
Eosinophils Absolute: 0.1 10*3/uL (ref 0.0–0.5)
Eosinophils Relative: 2 %
HCT: 43.3 % (ref 36.0–46.0)
Hemoglobin: 14.8 g/dL (ref 12.0–15.0)
Immature Granulocytes: 0 %
Lymphocytes Relative: 19 %
Lymphs Abs: 1.1 10*3/uL (ref 0.7–4.0)
MCH: 33.1 pg (ref 26.0–34.0)
MCHC: 34.2 g/dL (ref 30.0–36.0)
MCV: 96.9 fL (ref 80.0–100.0)
Monocytes Absolute: 1 10*3/uL (ref 0.1–1.0)
Monocytes Relative: 17 %
Neutro Abs: 3.4 10*3/uL (ref 1.7–7.7)
Neutrophils Relative %: 61 %
Platelets: 227 10*3/uL (ref 150–400)
RBC: 4.47 MIL/uL (ref 3.87–5.11)
RDW: 13.2 % (ref 11.5–15.5)
WBC: 5.7 10*3/uL (ref 4.0–10.5)
nRBC: 0 % (ref 0.0–0.2)

## 2022-12-21 LAB — COMPREHENSIVE METABOLIC PANEL
ALT: 62 U/L — ABNORMAL HIGH (ref 0–44)
AST: 42 U/L — ABNORMAL HIGH (ref 15–41)
Albumin: 4.1 g/dL (ref 3.5–5.0)
Alkaline Phosphatase: 62 U/L (ref 38–126)
Anion gap: 10 (ref 5–15)
BUN: 14 mg/dL (ref 6–20)
CO2: 26 mmol/L (ref 22–32)
Calcium: 9.6 mg/dL (ref 8.9–10.3)
Chloride: 101 mmol/L (ref 98–111)
Creatinine, Ser: 0.72 mg/dL (ref 0.44–1.00)
GFR, Estimated: >60 mL/min (ref >60)
Glucose, Bld: 89 mg/dL (ref 70–99)
Potassium: 4.1 mmol/L (ref 3.5–5.1)
Sodium: 137 mmol/L (ref 135–145)
Total Bilirubin: 0.6 mg/dL (ref 0.3–1.2)
Total Protein: 7.3 g/dL (ref 6.5–8.1)

## 2022-12-21 MED ORDER — ONDANSETRON HCL 8 MG PO TABS: 8.0000 mg | ORAL_TABLET | Freq: Three times a day (TID) | ORAL | 3 refills | Status: DC | PRN

## 2022-12-21 NOTE — Progress Notes (Signed)
rash over her right breast and her right axilla  BREAST: Examination not performed as patient has severe sensitivity due to radiation LUNGS: clear to auscultation and percussion with normal breathing effort HEART: regular rate & rhythm and no murmurs and no lower extremity edema ABDOMEN:abdomen soft, non-tender and normal bowel sounds Musculoskeletal:no cyanosis of digits and no clubbing  NEURO: alert & oriented x 3 with fluent speech  LABORATORY DATA:  I have reviewed the data as listed    Component Value Date/Time   NA 137 12/21/2022 1159   K 4.1 12/21/2022 1159   CL 101 12/21/2022 1159   CO2 26 12/21/2022 1159   GLUCOSE 89 12/21/2022 1159    BUN 14 12/21/2022 1159   CREATININE 0.72 12/21/2022 1159   CALCIUM 9.6 12/21/2022 1159   PROT 7.3 12/21/2022 1159   ALBUMIN 4.1 12/21/2022 1159   AST 42 (H) 12/21/2022 1159   ALT 62 (H) 12/21/2022 1159   ALKPHOS 62 12/21/2022 1159   BILITOT 0.6 12/21/2022 1159   GFRNONAA >60 12/21/2022 1159   GFRAA  01/08/2007 1009    >60        The eGFR has been calculated using the MDRD equation. This calculation has not been validated in all clinical   Lab Results  Component Value Date   WBC 5.7 12/21/2022   NEUTROABS 3.4 12/21/2022   HGB 14.8 12/21/2022   HCT 43.3 12/21/2022   MCV 96.9 12/21/2022   PLT 227 12/21/2022      Chemistry      Component Value Date/Time   NA 137 12/21/2022 1159   K 4.1 12/21/2022 1159   CL 101 12/21/2022 1159   CO2 26 12/21/2022 1159   BUN 14 12/21/2022 1159   CREATININE 0.72 12/21/2022 1159      Component Value Date/Time   CALCIUM 9.6 12/21/2022 1159   ALKPHOS 62 12/21/2022 1159   AST 42 (H) 12/21/2022 1159   ALT 62 (H) 12/21/2022 1159   BILITOT 0.6 12/21/2022 1159       RADIOGRAPHIC STUDIES: I have personally reviewed the radiological images as listed and agreed with the findings in the report. DG Bone Density  Result Date: 12/01/2022 EXAM: DUAL X-RAY ABSORPTIOMETRY (DXA) FOR BONE MINERAL DENSITY IMPRESSION: Your patient [name] completed a BMD test on 12/01/2022 using the [employer] DXA System (software version: 14.10) manufactured by [employer]. The following summarizes the results of our evaluation. Technologist: [facility] PATIENT BIOGRAPHICAL: Name: [name], [name] Patient ID: [id number] Birth Date: 1966/01/07 Height: 60.0 in. Gender: Female Exam Date: 12/01/2022 Weight: 175.5 lbs. Indications: Breast Ca, Caucasian, History of Fracture (Adult), Post Menopausal Fractures: Foot Treatments: Calcium, Letrozole, Multivitamin DENSITOMETRY RESULTS: Site      Region     Measured Date Measured Age WHO Classification Young Adult T-score  BMD         %Change vs. Previous Significant Change (*) AP Spine L1-L4 12/01/20[age].1 Osteopenia -1.6 0.983 g/cm2 - - DualFemur Neck Left 12/01/2022 57.1 Osteopenia -2.1 0.748 g/cm2 - - DualFemur Total Mean 12/01/2022 57.1 Osteopenia -1.2 0.854 g/cm2 - - ASSESSMENT: The BMD measured at Femur Neck Left is 0.748 g/cm2 with a T-score of -2.1. This patient is considered osteopenic according to World Health Organization [facility]) criteria. The scan quality is good. World Science writer [facility]) criteria for post-menopausal, Caucasian Women: Normal:       T-score at or above -1 SD Osteopenia:   T-score between -1 and -2.5 SD Osteoporosis: T-score at or below -2.5 SD  RECOMMENDATIONS: 1. All patients should optimize calcium and vitamin D intake. 2. Consider FDA-approved medical therapies in postmenopausal women and med aged 37 years and older, based on the following: a. A hip or vertebral (clinical or morphometric) fracture b. T-score< -2.5 at the femoral neck or spine after appropriate evaluation to exclude secondary causes c. Low bone mass (T-score between -1.0 and -2.5 at the femoral neck or spine) and a 10-year probability of a hip fracture > 3% or a 10-year probability of a major osteoporosis-related fracture > 20% based on the [place]-adapted WHO algorithm d. Clinician judgment and/or patient preferences may indicate treatment for people with 10-year fracture probabilities above or below these levels FOLLOW-UP: Patients with diagnosis of osteoporsis or at high risk for fracture should have regular bone mineral density tests. For patients eligible for Medicare, routine testing is allowed once every 2 years. The testing frequency can be increased to one year for patients who have rapidly progressing disease, those who are receiving or discontinuing medical therapy to restore bone mass, or have additional risk factors. I have reviewed this report, and agree with the above findings. [facility] Your patient  [name] completed a FRAX assessment on 12/01/2022 using the [employer] DXA System (analysis version: 14.10) manufactured by [employer]. The following summarizes the results of our evaluation. PATIENT BIOGRAPHICAL: Name: [name], [name] Patient ID: [id number] Birth Date: [birth year] Height:    60.0 in. Gender:     Female    [age]       Weight:    175.5 lbs. Ethnicity:  White                            Exam Date: 12/01/2022 FRAX* RESULTS:  (version: 3.5) 10-year Probability of Fracture1 Major Osteoporotic Fracture2 Hip Fracture 14.5% 2.0% Population: [place] (Caucasian) Risk Factors: History of Fracture (Adult) Based on Femur (Left) Neck BMD 1 -The 10-year probability of fracture may be lower than reported if the patient has received treatment. 2 -Major Osteoporotic Fracture: Clinical Spine, Forearm, Hip or Shoulder *FRAX is a Armed forces logistics/support/administrative officer of the [employer] for Metabolic Bone Disease, a World Science writer (WHO) [employer]. ASSESSMENT: The probability of a major osteoporotic fracture is 14.5% within the next ten years. The probability of a hip fracture is 2.0% within the next ten years. Electronically Signed   By: Romona Curls M.D.   On: 12/01/2022 14:19  RECOMMENDATIONS: 1. All patients should optimize calcium and vitamin D intake. 2. Consider FDA-approved medical therapies in postmenopausal women and med aged 37 years and older, based on the following: a. A hip or vertebral (clinical or morphometric) fracture b. T-score< -2.5 at the femoral neck or spine after appropriate evaluation to exclude secondary causes c. Low bone mass (T-score between -1.0 and -2.5 at the femoral neck or spine) and a 10-year probability of a hip fracture > 3% or a 10-year probability of a major osteoporosis-related fracture > 20% based on the [place]-adapted WHO algorithm d. Clinician judgment and/or patient preferences may indicate treatment for people with 10-year fracture probabilities above or below these levels FOLLOW-UP: Patients with diagnosis of osteoporsis or at high risk for fracture should have regular bone mineral density tests. For patients eligible for Medicare, routine testing is allowed once every 2 years. The testing frequency can be increased to one year for patients who have rapidly progressing disease, those who are receiving or discontinuing medical therapy to restore bone mass, or have additional risk factors. I have reviewed this report, and agree with the above findings. [facility] Your patient  [name] completed a FRAX assessment on 12/01/2022 using the [employer] DXA System (analysis version: 14.10) manufactured by [employer]. The following summarizes the results of our evaluation. PATIENT BIOGRAPHICAL: Name: [name], [name] Patient ID: [id number] Birth Date: [birth year] Height:    60.0 in. Gender:     Female    Age:        56.1       Weight:    175.5 lbs. Ethnicity:  White                            Exam Date: 12/01/2022 FRAX* RESULTS:  (version: 3.5) 10-year Probability of Fracture1 Major Osteoporotic Fracture2 Hip Fracture 14.5% 2.0% Population: [place] (Caucasian) Risk Factors: History of Fracture (Adult) Based on Femur (Left) Neck BMD 1 -The 10-year probability of fracture may be lower than reported if the patient has received treatment. 2 -Major Osteoporotic Fracture: Clinical Spine, Forearm, Hip or Shoulder *FRAX is a Armed forces logistics/support/administrative officer of the [employer] for Metabolic Bone Disease, a World Science writer (WHO) [employer]. ASSESSMENT: The probability of a major osteoporotic fracture is 14.5% within the next ten years. The probability of a hip fracture is 2.0% within the next ten years. Electronically Signed   By: Romona Curls M.D.   On: 12/01/2022 14:19  rash over her right breast and her right axilla  BREAST: Examination not performed as patient has severe sensitivity due to radiation LUNGS: clear to auscultation and percussion with normal breathing effort HEART: regular rate & rhythm and no murmurs and no lower extremity edema ABDOMEN:abdomen soft, non-tender and normal bowel sounds Musculoskeletal:no cyanosis of digits and no clubbing  NEURO: alert & oriented x 3 with fluent speech  LABORATORY DATA:  I have reviewed the data as listed    Component Value Date/Time   NA 137 12/21/2022 1159   K 4.1 12/21/2022 1159   CL 101 12/21/2022 1159   CO2 26 12/21/2022 1159   GLUCOSE 89 12/21/2022 1159    BUN 14 12/21/2022 1159   CREATININE 0.72 12/21/2022 1159   CALCIUM 9.6 12/21/2022 1159   PROT 7.3 12/21/2022 1159   ALBUMIN 4.1 12/21/2022 1159   AST 42 (H) 12/21/2022 1159   ALT 62 (H) 12/21/2022 1159   ALKPHOS 62 12/21/2022 1159   BILITOT 0.6 12/21/2022 1159   GFRNONAA >60 12/21/2022 1159   GFRAA  01/08/2007 1009    >60        The eGFR has been calculated using the MDRD equation. This calculation has not been validated in all clinical   Lab Results  Component Value Date   WBC 5.7 12/21/2022   NEUTROABS 3.4 12/21/2022   HGB 14.8 12/21/2022   HCT 43.3 12/21/2022   MCV 96.9 12/21/2022   PLT 227 12/21/2022      Chemistry      Component Value Date/Time   NA 137 12/21/2022 1159   K 4.1 12/21/2022 1159   CL 101 12/21/2022 1159   CO2 26 12/21/2022 1159   BUN 14 12/21/2022 1159   CREATININE 0.72 12/21/2022 1159      Component Value Date/Time   CALCIUM 9.6 12/21/2022 1159   ALKPHOS 62 12/21/2022 1159   AST 42 (H) 12/21/2022 1159   ALT 62 (H) 12/21/2022 1159   BILITOT 0.6 12/21/2022 1159       RADIOGRAPHIC STUDIES: I have personally reviewed the radiological images as listed and agreed with the findings in the report. DG Bone Density  Result Date: 12/01/2022 EXAM: DUAL X-RAY ABSORPTIOMETRY (DXA) FOR BONE MINERAL DENSITY IMPRESSION: Your patient [name] completed a BMD test on 12/01/2022 using the [employer] DXA System (software version: 14.10) manufactured by [employer]. The following summarizes the results of our evaluation. Technologist: [facility] PATIENT BIOGRAPHICAL: Name: [name], [name] Patient ID: [id number] Birth Date: 1966/01/07 Height: 60.0 in. Gender: Female Exam Date: 12/01/2022 Weight: 175.5 lbs. Indications: Breast Ca, Caucasian, History of Fracture (Adult), Post Menopausal Fractures: Foot Treatments: Calcium, Letrozole, Multivitamin DENSITOMETRY RESULTS: Site      Region     Measured Date Measured Age WHO Classification Young Adult T-score  BMD         %Change vs. Previous Significant Change (*) AP Spine L1-L4 12/01/2022 57.1 Osteopenia -1.6 0.983 g/cm2 - - DualFemur Neck Left 12/01/2022 57.1 Osteopenia -2.1 0.748 g/cm2 - - DualFemur Total Mean 12/01/2022 57.1 Osteopenia -1.2 0.854 g/cm2 - - ASSESSMENT: The BMD measured at Femur Neck Left is 0.748 g/cm2 with a T-score of -2.1. This patient is considered osteopenic according to World Health Organization [facility]) criteria. The scan quality is good. World Science writer [facility]) criteria for post-menopausal, Caucasian Women: Normal:       T-score at or above -1 SD Osteopenia:   T-score between -1 and -2.5 SD Osteoporosis: T-score at or below -2.5 SD

## 2022-12-21 NOTE — Assessment & Plan Note (Addendum)
Completed radiation therapy and started on Letrozole. Experiencing nausea, possibly due to Letrozole. No joint pains reported. Hot flashes present but not severe. -Prescribe Zofran for nausea as needed. -Continue Letrozole and monitor for side effects. -Consider SSRI for hot flashes if they become disruptive to daily activities. -Continue regular mammograms annually (next due July 2025). -Follow-up in three months with labs to monitor liver enzymes.

## 2022-12-21 NOTE — Assessment & Plan Note (Signed)
Detected on recent bone density scan. No symptoms reported. -Continue Vitamin D and calcium supplements. -Encourage strength training exercises. -Repeat bone density scan in two years.

## 2022-12-21 NOTE — Assessment & Plan Note (Signed)
Patient reports poor sleep due to stress and anxiety. -Refer to Child psychotherapist for psychosocial support.

## 2022-12-21 NOTE — Patient Instructions (Signed)
VISIT SUMMARY:  During today's visit, we discussed your ongoing symptoms and concerns, including nausea, hot flashes, stress-related insomnia, and a skin issue under your arm. We reviewed your current treatments and made adjustments to help manage your symptoms more effectively.  YOUR PLAN:  -BREAST CANCER (PRE-CANCEROUS): You have a history of a precancerous breast condition and are currently on Letrozole and have completed radiation therapy. Letrozole may be causing your nausea and hot flashes. We have prescribed Zofran to help manage your nausea. Please continue taking Letrozole and monitor for any side effects. If hot flashes become more disruptive, we can consider medication to help manage them.  -ELEVATED LIVER ENZYMES: Your liver enzymes are slightly elevated, which may be due to Letrozole. We will continue to monitor your liver enzymes while you remain on Letrozole.  -OSTEOPENIA: Osteopenia means your bones are weaker than normal but not to the extent of osteoporosis. Continue taking Vitamin D and calcium supplements, and engage in strength training exercises. We will repeat your bone density scan in two years.  -INSOMNIA: Your difficulty sleeping is likely due to stress and anxiety. We will refer you to a social worker for additional support.  -GENERAL HEALTH MAINTENANCE: Continue with your regular health maintenance, including annual mammograms. Your next mammogram is due in July 2025. We will follow up in three months with labs to monitor your liver enzymes.  INSTRUCTIONS:  Please follow up in three months for lab tests to monitor your liver enzymes. If you experience any new or worsening symptoms, contact our office immediately.

## 2022-12-21 NOTE — Radiation Completion Notes (Signed)
Patient Name: [name], [name] MRN: [id number] Date of Birth: 04/23/1965 Referring Physician: Emelia Loron, M.D. Date of Service: 2022-12-21 Radiation Oncologist: Lonie Peak, M.D. 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 Intent: Curative     ==========DELIVERED PLANS==========  First Treatment Date: 2022-11-23 - Last Treatment Date: 2022-12-20   Plan Name: Breast_L_BH Site: Breast, Left Technique: 3D Mode: Photon Dose Per Fraction: 2.67 Gy Prescribed Dose (Delivered / Prescribed): 40.05 Gy / 40.05 Gy Prescribed Fxs (Delivered / Prescribed): 15 / 15   Plan Name: Brst_L_BH_Bst Site: Breast, Left Technique: 3D Mode: Photon Dose Per Fraction: 2 Gy Prescribed Dose (Delivered / Prescribed): 10 Gy / 10 Gy Prescribed Fxs (Delivered / Prescribed): 5 / 5     ==========ON TREATMENT VISIT DATES========== 2022-11-25, 2022-12-05, 2022-12-09, 2022-12-12, 2022-12-19     ==========UPCOMING VISITS==========       ==========APPENDIX - ON TREATMENT VISIT NOTES==========   See weekly On Treatment Notes in Epic for details.

## 2022-12-21 NOTE — Assessment & Plan Note (Signed)
Mildly elevated, possibly due to Letrozole. No abdominal pain reported. -Continue Letrozole and monitor liver enzymes.

## 2022-12-26 ENCOUNTER — Encounter: Payer: Self-pay | Admitting: Radiation Oncology

## 2023-01-05 ENCOUNTER — Inpatient Hospital Stay: Payer: 59 | Attending: Oncology | Admitting: Licensed Clinical Social Worker

## 2023-01-05 DIAGNOSIS — D0512 Intraductal carcinoma in situ of left breast: Secondary | ICD-10-CM

## 2023-01-05 NOTE — Progress Notes (Signed)
[facility] Clinical Social Work  Initial Assessment   [name] is a 57 y.o. year old female contacted by phone. Clinical Social Work was referred by medical provider for assessment of psychosocial needs.   SDOH (Social Determinants of Health) assessments performed: Yes   SDOH Screenings   Food Insecurity: No Food Insecurity (11/08/2022)  Housing: Low Risk  (11/08/2022)  Transportation Needs: No Transportation Needs (11/08/2022)  Utilities: Not At Risk (11/08/2022)  Depression (PHQ2-9): Low Risk  (11/08/2022)  Tobacco Use: Medium Risk (12/21/2022)  Health Literacy: Adequate Health Literacy (10/07/2022)     Distress Screen completed: No     No data to display            Family/Social Information:  Housing Arrangement: patient lives with her partner.   Family members/support persons in your life? Pt reports she has 2 adult sons who reside locally as well as other family and close friends who have offered support.  Pt states she is well supported at this time.  Transportation concerns: no  Employment: Working full time pt is self employed as an Airline pilot.  Income source: Employment Financial concerns: No Type of concern: None Food access concerns: no Religious or spiritual practice: Yes-Christian Services Currently in place:  none  Coping/ Adjustment to diagnosis: Patient understands treatment plan and what happens next? yes Concerns about diagnosis and/or treatment: Overwhelmed by information Patient reported stressors: Anxiety/ nervousness and Adjusting to my illness Hopes and/or priorities: Pt's priority is to complete treatment w/ the hope of positive results. Patient enjoys time with family/ friends Current coping skills/ strengths: Capable of independent living , Contractor , Motivation for treatment/growth , Physical Health , and Supportive family/friends     SUMMARY: Current SDOH Barriers:  No barriers identified at this time.  Clinical Social Work  Clinical Goal(s):  No clinical social work goals at this time  Interventions: Discussed common feeling and emotions when being diagnosed with cancer, and the importance of support during treatment Informed patient of the support team roles and support services at [facility] Provided CSW contact information and encouraged patient to call with any questions or concerns Provided patient with information about supportive groups available through [facility].  Discussed Marijean Niemann, pt declines referral at this time.  Pt experiencing anxiety.   Pt experienced a number of life changing events at the time of cancer diagnosis.  Coping mechanisms discussed.  Pt provided w/ contact information for CSW should she wish to pursue counseling at any point.     Follow Up Plan: Patient will contact CSW with any support or resource needs Patient verbalizes understanding of plan: Yes    Rachel Moulds, LCSW Clinical Social Worker [facility]

## 2023-01-12 ENCOUNTER — Encounter: Payer: Self-pay | Admitting: Radiation Oncology

## 2023-01-12 ENCOUNTER — Ambulatory Visit
Admission: RE | Admit: 2023-01-12 | Discharge: 2023-01-12 | Disposition: A | Discharge status: Home / Self Care | Payer: 59 | Source: Ambulatory Visit | Attending: Radiation Oncology | Admitting: Radiation Oncology

## 2023-01-12 NOTE — Progress Notes (Signed)
[name] is here today for follow up post radiation to the breast.   Breast Side: LT  They completed their radiation on: 12/20/2022   Does the patient complain of any of the following:  Post radiation skin issues: None Breast Tenderness: Occasional sharp/ shooting pains 8/10. Breast Swelling: Mild Lymphadema: None Range of Motion limitations: None-LT side Fatigue post radiation: None Appetite good/fair/poor: None Hormone Therapy Start: 11/2022  Additional comments if applicable: None  This concludes the interaction.  Ruel Favors, LPN

## 2023-02-07 ENCOUNTER — Other Ambulatory Visit (HOSPITAL_COMMUNITY)
Admission: RE | Admit: 2023-02-07 | Discharge: 2023-02-07 | Disposition: A | Discharge status: Home / Self Care | Payer: 59 | Source: Ambulatory Visit | Attending: Nurse Practitioner | Admitting: Nurse Practitioner

## 2023-02-07 ENCOUNTER — Other Ambulatory Visit: Payer: Self-pay | Admitting: Nurse Practitioner

## 2023-02-07 DIAGNOSIS — Z124 Encounter for screening for malignant neoplasm of cervix: Secondary | ICD-10-CM | POA: Diagnosis present

## 2023-02-11 LAB — CYTOLOGY - PAP
Comment: NEGATIVE
Comment: NEGATIVE
Comment: NEGATIVE
Diagnosis: UNDETERMINED — AB
HPV 16: NEGATIVE
HPV 18 / 45: NEGATIVE
High risk HPV: POSITIVE — AB

## 2023-03-16 ENCOUNTER — Inpatient Hospital Stay: Payer: 59 | Attending: Oncology

## 2023-03-16 DIAGNOSIS — R7989 Other specified abnormal findings of blood chemistry: Secondary | ICD-10-CM

## 2023-03-16 DIAGNOSIS — D0512 Intraductal carcinoma in situ of left breast: Secondary | ICD-10-CM | POA: Insufficient documentation

## 2023-03-16 DIAGNOSIS — Z170 Estrogen receptor positive status [ER+]: Secondary | ICD-10-CM | POA: Insufficient documentation

## 2023-03-16 DIAGNOSIS — Z79811 Long term (current) use of aromatase inhibitors: Secondary | ICD-10-CM | POA: Diagnosis not present

## 2023-03-16 DIAGNOSIS — R61 Generalized hyperhidrosis: Secondary | ICD-10-CM | POA: Insufficient documentation

## 2023-03-16 DIAGNOSIS — R110 Nausea: Secondary | ICD-10-CM | POA: Diagnosis not present

## 2023-03-16 DIAGNOSIS — M8580 Other specified disorders of bone density and structure, unspecified site: Secondary | ICD-10-CM | POA: Insufficient documentation

## 2023-03-16 LAB — COMPREHENSIVE METABOLIC PANEL
ALT: 50 U/L — ABNORMAL HIGH (ref 0–44)
AST: 35 U/L (ref 15–41)
Albumin: 4 g/dL (ref 3.5–5.0)
Alkaline Phosphatase: 61 U/L (ref 38–126)
Anion gap: 10 (ref 5–15)
BUN: 11 mg/dL (ref 6–20)
CO2: 25 mmol/L (ref 22–32)
Calcium: 9.8 mg/dL (ref 8.9–10.3)
Chloride: 104 mmol/L (ref 98–111)
Creatinine, Ser: 0.87 mg/dL (ref 0.44–1.00)
GFR, Estimated: >60 mL/min (ref >60)
Glucose, Bld: 84 mg/dL (ref 70–99)
Potassium: 3.9 mmol/L (ref 3.5–5.1)
Sodium: 139 mmol/L (ref 135–145)
Total Bilirubin: 0.4 mg/dL (ref 0.0–1.2)
Total Protein: 6.8 g/dL (ref 6.5–8.1)

## 2023-03-16 LAB — CBC WITH DIFFERENTIAL/PLATELET
Abs Immature Granulocytes: 0.02 10*3/uL (ref 0.00–0.07)
Basophils Absolute: 0.1 10*3/uL (ref 0.0–0.1)
Basophils Relative: 2 %
Eosinophils Absolute: 0.2 10*3/uL (ref 0.0–0.5)
Eosinophils Relative: 4 %
HCT: 43.1 % (ref 36.0–46.0)
Hemoglobin: 14.7 g/dL (ref 12.0–15.0)
Immature Granulocytes: 1 %
Lymphocytes Relative: 27 %
Lymphs Abs: 1.2 10*3/uL (ref 0.7–4.0)
MCH: 32.2 pg (ref 26.0–34.0)
MCHC: 34.1 g/dL (ref 30.0–36.0)
MCV: 94.3 fL (ref 80.0–100.0)
Monocytes Absolute: 0.6 10*3/uL (ref 0.1–1.0)
Monocytes Relative: 14 %
Neutro Abs: 2.3 10*3/uL (ref 1.7–7.7)
Neutrophils Relative %: 52 %
Platelets: 303 10*3/uL (ref 150–400)
RBC: 4.57 MIL/uL (ref 3.87–5.11)
RDW: 11.6 % (ref 11.5–15.5)
WBC: 4.3 10*3/uL (ref 4.0–10.5)
nRBC: 0 % (ref 0.0–0.2)

## 2023-03-23 ENCOUNTER — Inpatient Hospital Stay: Payer: 59 | Admitting: Oncology

## 2023-03-23 ENCOUNTER — Other Ambulatory Visit (HOSPITAL_COMMUNITY): Payer: Self-pay | Admitting: Oncology

## 2023-03-23 VITALS — BP 111/71 | HR 80 | Temp 98.0°F | Resp 18 | Ht 60.0 in | Wt 162.9 lb

## 2023-03-23 DIAGNOSIS — M85852 Other specified disorders of bone density and structure, left thigh: Secondary | ICD-10-CM

## 2023-03-23 DIAGNOSIS — N63 Unspecified lump in unspecified breast: Secondary | ICD-10-CM | POA: Insufficient documentation

## 2023-03-23 DIAGNOSIS — R7989 Other specified abnormal findings of blood chemistry: Secondary | ICD-10-CM | POA: Diagnosis not present

## 2023-03-23 DIAGNOSIS — N6321 Unspecified lump in the left breast, upper outer quadrant: Secondary | ICD-10-CM | POA: Diagnosis not present

## 2023-03-23 DIAGNOSIS — D0512 Intraductal carcinoma in situ of left breast: Secondary | ICD-10-CM | POA: Diagnosis not present

## 2023-03-23 NOTE — Assessment & Plan Note (Signed)
Mildly elevated, possibly due to Letrozole and alcohol use.  Trended down since cutting down alcohol.  No abdominal pain reported. -Continue Letrozole and monitor liver enzymes.

## 2023-03-23 NOTE — Patient Instructions (Signed)
VISIT SUMMARY:  You came in today for a follow-up after completing radiation therapy for breast cancer. We discussed your occasional nausea, night sweats, and a lump at the site of your surgery. You have been proactive in managing your health, including participating in 'Dry January' and joining [employer], which has resulted in weight loss and improvements in your sleep and acid reflux symptoms. We also reviewed your current supplements for osteopenia and your liver enzyme levels.  YOUR PLAN:  -POST-SURGICAL SCAR TISSUE: The lump you noticed at the surgical site is likely scar tissue from your surgery. We will order a breast ultrasound to confirm this.  -OSTEOPENIA: Osteopenia means your bones are weaker than normal but not so far gone that they break easily. Continue taking your Vitamin D and calcium supplements. We will repeat your DEXA scan in two years to monitor your bone density.  -ELEVATED LIVER ENZYMES: Your liver enzymes are mildly elevated, likely due to Letrozole, but they are improving. We will continue to monitor them.  -WEIGHT LOSS: You have lost a significant amount of weight due to your healthy lifestyle changes, including diet and abstaining from alcohol. Keep up the good work with these healthy habits.  -GENERAL HEALTH MAINTENANCE: We will schedule a mammogram for July. Please return for a follow-up appointment in three months.  INSTRUCTIONS:  Please schedule a breast ultrasound to check the lump at your surgical site. Continue taking your Vitamin D and calcium supplements. We will repeat your DEXA scan in two years. Keep monitoring your liver enzymes. Schedule a mammogram for July and return for a follow-up appointment in three months.

## 2023-03-23 NOTE — Assessment & Plan Note (Signed)
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  Completed radiation therapy and started on Letrozole. Experiencing nausea due to Letrozole. No joint pains reported.  Reports some night sweats but not severe. -Continue Zofran for nausea as needed. -Continue Letrozole and monitor for side effects.  -Consider SSRI for hot flashes if they become disruptive to daily activities.  Follow-up in three months with labs to monitor liver enzymes.

## 2023-03-23 NOTE — Assessment & Plan Note (Signed)
Detected on recent bone density scan. No symptoms reported. -Continue Vitamin D and calcium supplements. -Encourage strength training exercises. -Repeat bone density scan in two years.

## 2023-03-23 NOTE — Progress Notes (Signed)
Patient Care Team: Ollen Bowl, MD as PCP - General (Internal Medicine) Cindie Crumbly, MD as Medical Oncologist (Oncology) Therese Sarah, RN as Oncology Nurse Navigator (Medical Oncology) Lonie Peak, MD as Attending Physician (Radiation Oncology)  Clinic Day:  03/23/2023  Referring physician: Ollen Bowl, MD   CHIEF COMPLAINT:  CC: DCIS   ASSESSMENT & PLAN:   Assessment & Plan: [name]  is a 58 y.o. female with DCIS s/p lumpectomy and RT. Currently on Letrozole.   Ductal carcinoma in situ (DCIS) of left breast Patient with left breast DCIS s/p lumpectomy 08/24.  Completed radiation therapy and started on Letrozole. Experiencing nausea due to Letrozole. No joint pains reported.  Reports some night sweats but not severe. -Continue Zofran for nausea as needed. -Continue Letrozole and monitor for side effects.  -Consider SSRI for hot flashes if they become disruptive to daily activities.  Follow-up in three months with labs to monitor liver enzymes.  Elevated LFTs Mildly elevated, possibly due to Letrozole and alcohol use.  Trended down since cutting down alcohol.  No abdominal pain reported. -Continue Letrozole and monitor liver enzymes.  Osteopenia Detected on recent bone density scan. No symptoms reported. -Continue Vitamin D and calcium supplements. -Encourage strength training exercises. -Repeat bone density scan in two years.  Breast lump Breast lump palpated underneath the lumpectomy scar.  Likely scar tissue. -Will repeat breast imaging to confirm    The patient understands the plans discussed today and is in agreement with them.  She knows to contact our office if she develops concerns prior to her next appointment.  I provided 20 minutes of face-to-face time during this encounter and > 50% was spent counseling as documented under my assessment and plan.    Cindie Crumbly, MD  [facility] [address] [place] [address] Dept: [phone] Dept Fax: [phone]   Orders Placed This Encounter  Procedures   US Breast Limited Uni Left Inc Axilla    Standing Status:   Future    Expected Date:   06/12/2023    Expiration Date:   03/22/2024    Reason for Exam (SYMPTOM  OR DIAGNOSIS REQUIRED):   Lump near lumpectomy scar    Preferred imaging location?:   [facility]   Comprehensive metabolic panel    Standing Status:   Future    Expected Date:   06/12/2023    Expiration Date:   03/22/2024   CBC with Differential/Platelet    Standing Status:   Future    Expected Date:   06/12/2023    Expiration Date:   03/22/2024     ONCOLOGY HISTORY:   Oncology History  Ductal carcinoma in situ (DCIS) of left breast  09/13/2022 Mammogram   FINDINGS: In the left breast, calcifications warrant further evaluation. In the right breast, no findings suspicious for malignancy.   09/21/2022 Pathology Results   Left breast core needle biopsy:  DCIS, intermediate nuclear grade, solid and cribriform types without necrosis Negative for invasive carcinoma Microcalcifications present within DCIS DCIS measures 5 mm in greatest linear extent   10/10/2022 Genetic Testing   BRCA1/2 analysis with [employer]: Negative BARD1, DICER1: Variants of unknown significance identified   10/11/2022 Initial Diagnosis   Ductal carcinoma in situ (DCIS) of left breast   10/18/2022 Pathology Results   FINAL MICROSCOPIC DIAGNOSIS:   A. BREAST, LEFT, LUMPECTOMY:  Focal residual ductal carcinoma in situ, type solid, nuclear grade 2 of  3, without necrosis  DCIS greatest dimension: 2 mm  Margins:  negative      Closest margin: all >10 mm  Prognostic markers: ER Positive, PR positive  Biopsy site and clip  Other findings: N/A     10/18/2022 Procedure   Left breast lumpectomy   10/18/2022 Cancer Staging   Staging form: Breast, AJCC 8th Edition - Clinical stage from 10/18/2022: Stage 0 (cTis  (DCIS), cN0, cM0, ER+, PR+, HER2: Not Assessed) - Signed by Cindie Crumbly, MD on 03/23/2023 Nuclear grade: G2   11/25/2022 -  Radiation Therapy   RT completed 11/25/22 to 12/20/22.   02/15/2023 -  Chemotherapy   Started Letrozole 2.5mg  twice daily       Current Treatment:  Letrozole  INTERVAL HISTORY:  [name] is here today for follow up.  She reports occasional nausea, managed with Zofran, which has improved since the initial stages of treatment. She also experiences night sweats, but denies daytime hot flashes. She has noticed a lump at the site of her surgery, which she has been massaging in an attempt to reduce it. She expresses concern about this lump and is unsure if it is a result of the surgery or radiation therapy. She also reports soreness on the side of her body that received radiation.  The patient has been proactive in managing her health, participating in 'Dry January' and joining [employer]. She has lost eight pounds in the past month and has noticed improvements in her sleep and acid reflux symptoms. She is also taking over-the-counter vitamin D and calcium supplements for osteopenia.   ALLERGIES:  is allergic to erythromycin, levofloxacin, penicillins, sulfamethoxazole-trimethoprim, and sulfa antibiotics.  MEDICATIONS:  Current Outpatient Medications  Medication Sig Dispense Refill   ALPRAZolam (XANAX) 0.25 MG tablet Take 0.25 mg by mouth at bedtime as needed for anxiety.     Calcium Carbonate (CALCIUM 500 PO) Take by mouth.     cetirizine (ZYRTEC) 10 MG tablet Take 10 mg by mouth daily.     cycloSPORINE (VEVYE) 0.1 % SOLN Apply 1 drop to eye daily.     escitalopram (LEXAPRO) 20 MG tablet      Glucosamine 750 MG TABS Take by mouth.     hydrocortisone (ANUSOL-HC) 2.5 % rectal cream 1 application Externally Twice a day for 30 days     ibuprofen (ADVIL) 800 MG tablet Take 1 tablet (800 mg total) by mouth 3 (three) times daily. 21 tablet 0   letrozole  (FEMARA) 2.5 MG tablet Take 1 tablet (2.5 mg total) by mouth daily. 90 tablet 1   Multiple Vitamin (MULTIVITAMIN) capsule Take 1 capsule by mouth daily.     ondansetron (ZOFRAN) 8 MG tablet Take 1 tablet (8 mg total) by mouth every 8 (eight) hours as needed for nausea. 30 tablet 3   pantoprazole (PROTONIX) 20 MG tablet      valACYclovir (VALTREX) 500 MG tablet      No current facility-administered medications for this visit.   REVIEW OF SYSTEMS:   Constitutional: Denies fevers, chills or abnormal weight loss Eyes: Denies blurriness of vision Ears, nose, mouth, throat, and face: Denies mucositis or sore throat Respiratory: Denies cough, dyspnea or wheezes Cardiovascular: Denies palpitation, chest discomfort or lower extremity swelling Gastrointestinal:  Denies nausea, heartburn or change in bowel habits Skin: Denies abnormal skin rashes Lymphatics: Denies new lymphadenopathy or easy bruising Neurological:Denies numbness, tingling or new weaknesses Behavioral/Psych: Mood is stable, no new changes  All other systems were reviewed with the  patient and are negative.   VITALS:  Blood pressure 111/71, pulse 80, temperature 98 F (36.7 C), resp. rate 18, height 5' (1.524 m), weight 162 lb 14.7 oz (73.9 kg), last menstrual period 10/17/2014, SpO2 97%.  Wt Readings from Last 3 Encounters:  03/23/23 162 lb 14.7 oz (73.9 kg)  12/21/22 174 lb 12.8 oz (79.3 kg)  11/16/22 175 lb 7.8 oz (79.6 kg)    Body mass index is 31.82 kg/m.  Performance status (ECOG): 0 - Asymptomatic  PHYSICAL EXAM:   GENERAL:alert, no distress and comfortable SKIN: skin color, texture, turgor are normal, thematous rash over her right breast and her right axilla  BREAST:Left breast: Area consistent with scar tissue at site of previous surgery, tender to palpation. Soreness on medial side, possibly related to radiation. Right breast: No masses palpated, non tender.  LUNGS: clear to auscultation and percussion with  normal breathing effort HEART: regular rate & rhythm and no murmurs and no lower extremity edema ABDOMEN:abdomen soft, non-tender and normal bowel sounds Musculoskeletal:no cyanosis of digits and no clubbing  NEURO: alert & oriented x 3 with fluent speech  LABORATORY DATA:  I have reviewed the data as listed    Component Value Date/Time   NA 139 03/16/2023 0806   K 3.9 03/16/2023 0806   CL 104 03/16/2023 0806   CO2 25 03/16/2023 0806   GLUCOSE 84 03/16/2023 0806   BUN 11 03/16/2023 0806   CREATININE 0.87 03/16/2023 0806   CALCIUM 9.8 03/16/2023 0806   PROT 6.8 03/16/2023 0806   ALBUMIN 4.0 03/16/2023 0806   AST 35 03/16/2023 0806   ALT 50 (H) 03/16/2023 0806   ALKPHOS 61 03/16/2023 0806   BILITOT 0.4 03/16/2023 0806   GFRNONAA >60 03/16/2023 0806   GFRAA  01/08/2007 1009    >60        The eGFR has been calculated using the MDRD equation. This calculation has not been validated in all clinical   Lab Results  Component Value Date   WBC 4.3 03/16/2023   NEUTROABS 2.3 03/16/2023   HGB 14.7 03/16/2023   HCT 43.1 03/16/2023   MCV 94.3 03/16/2023   PLT 303 03/16/2023      Chemistry      Component Value Date/Time   NA 139 03/16/2023 0806   K 3.9 03/16/2023 0806   CL 104 03/16/2023 0806   CO2 25 03/16/2023 0806   BUN 11 03/16/2023 0806   CREATININE 0.87 03/16/2023 0806      Component Value Date/Time   CALCIUM 9.8 03/16/2023 0806   ALKPHOS 61 03/16/2023 0806   AST 35 03/16/2023 0806   ALT 50 (H) 03/16/2023 0806   BILITOT 0.4 03/16/2023 4098

## 2023-03-23 NOTE — Assessment & Plan Note (Signed)
Breast lump palpated underneath the lumpectomy scar.  Likely scar tissue. -Will repeat breast imaging to confirm

## 2023-04-21 ENCOUNTER — Other Ambulatory Visit: Payer: Self-pay | Admitting: Oncology

## 2023-05-09 ENCOUNTER — Encounter: Payer: Self-pay | Admitting: Oncology

## 2023-05-16 ENCOUNTER — Encounter (HOSPITAL_COMMUNITY): Payer: 59

## 2023-05-16 ENCOUNTER — Ambulatory Visit (HOSPITAL_COMMUNITY): Payer: 59

## 2023-05-18 ENCOUNTER — Ambulatory Visit (HOSPITAL_COMMUNITY)
Admission: RE | Admit: 2023-05-18 | Discharge: 2023-05-18 | Disposition: A | Discharge status: Home / Self Care | Source: Ambulatory Visit | Attending: Oncology | Admitting: Oncology

## 2023-05-18 ENCOUNTER — Encounter (HOSPITAL_COMMUNITY): Payer: Self-pay

## 2023-05-18 DIAGNOSIS — D0512 Intraductal carcinoma in situ of left breast: Secondary | ICD-10-CM | POA: Diagnosis present

## 2023-05-18 DIAGNOSIS — N63 Unspecified lump in unspecified breast: Secondary | ICD-10-CM | POA: Diagnosis present

## 2023-06-15 ENCOUNTER — Inpatient Hospital Stay: Payer: 59 | Attending: Oncology

## 2023-06-15 DIAGNOSIS — D0512 Intraductal carcinoma in situ of left breast: Secondary | ICD-10-CM | POA: Insufficient documentation

## 2023-06-15 LAB — COMPREHENSIVE METABOLIC PANEL WITH GFR
ALT: 54 U/L — ABNORMAL HIGH (ref 0–44)
AST: 42 U/L — ABNORMAL HIGH (ref 15–41)
Albumin: 4.3 g/dL (ref 3.5–5.0)
Alkaline Phosphatase: 70 U/L (ref 38–126)
Anion gap: 13 (ref 5–15)
BUN: 11 mg/dL (ref 6–20)
CO2: 24 mmol/L (ref 22–32)
Calcium: 9.6 mg/dL (ref 8.9–10.3)
Chloride: 102 mmol/L (ref 98–111)
Creatinine, Ser: 0.62 mg/dL (ref 0.44–1.00)
GFR, Estimated: >60 mL/min (ref >60)
Glucose, Bld: 77 mg/dL (ref 70–99)
Potassium: 3.7 mmol/L (ref 3.5–5.1)
Sodium: 139 mmol/L (ref 135–145)
Total Bilirubin: 0.7 mg/dL (ref 0.0–1.2)
Total Protein: 7.5 g/dL (ref 6.5–8.1)

## 2023-06-15 LAB — CBC WITH DIFFERENTIAL/PLATELET
Abs Immature Granulocytes: 0.02 10*3/uL (ref 0.00–0.07)
Basophils Absolute: 0 10*3/uL (ref 0.0–0.1)
Basophils Relative: 1 %
Eosinophils Absolute: 0.1 10*3/uL (ref 0.0–0.5)
Eosinophils Relative: 2 %
HCT: 44 % (ref 36.0–46.0)
Hemoglobin: 15.1 g/dL — ABNORMAL HIGH (ref 12.0–15.0)
Immature Granulocytes: 0 %
Lymphocytes Relative: 30 %
Lymphs Abs: 1.5 10*3/uL (ref 0.7–4.0)
MCH: 32.3 pg (ref 26.0–34.0)
MCHC: 34.3 g/dL (ref 30.0–36.0)
MCV: 94 fL (ref 80.0–100.0)
Monocytes Absolute: 0.6 10*3/uL (ref 0.1–1.0)
Monocytes Relative: 12 %
Neutro Abs: 2.7 10*3/uL (ref 1.7–7.7)
Neutrophils Relative %: 55 %
Platelets: 225 10*3/uL (ref 150–400)
RBC: 4.68 MIL/uL (ref 3.87–5.11)
RDW: 14.5 % (ref 11.5–15.5)
WBC: 4.9 10*3/uL (ref 4.0–10.5)
nRBC: 0 % (ref 0.0–0.2)

## 2023-06-22 ENCOUNTER — Inpatient Hospital Stay: Payer: 59 | Attending: Oncology | Admitting: Oncology

## 2023-06-22 VITALS — BP 133/88 | HR 74 | Temp 98.2°F | Resp 18 | Ht 60.0 in | Wt 154.8 lb

## 2023-06-22 DIAGNOSIS — Z170 Estrogen receptor positive status [ER+]: Secondary | ICD-10-CM | POA: Insufficient documentation

## 2023-06-22 DIAGNOSIS — Z923 Personal history of irradiation: Secondary | ICD-10-CM | POA: Diagnosis not present

## 2023-06-22 DIAGNOSIS — M8580 Other specified disorders of bone density and structure, unspecified site: Secondary | ICD-10-CM | POA: Insufficient documentation

## 2023-06-22 DIAGNOSIS — D0512 Intraductal carcinoma in situ of left breast: Secondary | ICD-10-CM

## 2023-06-22 DIAGNOSIS — M85852 Other specified disorders of bone density and structure, left thigh: Secondary | ICD-10-CM | POA: Diagnosis not present

## 2023-06-22 DIAGNOSIS — R110 Nausea: Secondary | ICD-10-CM | POA: Diagnosis not present

## 2023-06-22 DIAGNOSIS — N6321 Unspecified lump in the left breast, upper outer quadrant: Secondary | ICD-10-CM | POA: Diagnosis not present

## 2023-06-22 DIAGNOSIS — R7989 Other specified abnormal findings of blood chemistry: Secondary | ICD-10-CM

## 2023-06-22 DIAGNOSIS — Z1721 Progesterone receptor positive status: Secondary | ICD-10-CM | POA: Insufficient documentation

## 2023-06-22 DIAGNOSIS — Z79811 Long term (current) use of aromatase inhibitors: Secondary | ICD-10-CM | POA: Insufficient documentation

## 2023-06-22 NOTE — Assessment & Plan Note (Signed)
 Mildly elevated, possibly due to Letrozole  and alcohol use.   Trended down previously since cutting down alcohol.   No abdominal pain reported.  -Continue Letrozole  and monitor liver enzymes. -Recommended to cut down alcohol

## 2023-06-22 NOTE — Patient Instructions (Signed)
 VISIT SUMMARY:  Today, we discussed your ongoing treatment with letrozole  for breast cancer and addressed several concerns, including hot flashes, weight loss, elevated liver enzymes, and breast swelling. We also reviewed your recent colonoscopy results and discussed your experience with tremors.  YOUR PLAN:  -ELEVATED LIVER ENZYMES: Your liver enzymes are slightly elevated, which may be due to your letrozole  medication and past alcohol consumption. We will monitor your liver enzymes closely and re-evaluate them in three months. Please continue to reduce your alcohol intake.  -HOT FLASHES: You are experiencing manageable hot flashes with a severity of 3 out of 10, likely due to letrozole . No changes to your current management are needed as they do not interfere with your daily activities.  -BREAST SWELLING: You have some breast swelling, possibly due to previous surgery or scar tissue. Your recent ultrasound showed no concerning findings. We will schedule a follow-up appointment in three months and consult with a surgeon regarding the swelling.  INSTRUCTIONS:  Please follow up in three months for a re-evaluation of your liver enzymes and a consultation with a surgeon regarding your breast swelling. Continue to monitor your weight and resume the [employer] program. If you have any new or worsening symptoms, please contact our office.

## 2023-06-22 NOTE — Assessment & Plan Note (Signed)
 Breast lump palpated underneath the lumpectomy scar.  Likely scar tissue. US : Not concerning for breast carcinoma  -I will reach out to patient surgeon Dr. Delane Fear for further recommendations and if he can see her for reassurance. -Will repeat breast imaging to confirm

## 2023-06-22 NOTE — Assessment & Plan Note (Signed)
 Detected on recent bone density scan (10/24). No symptoms reported.  -Continue Vitamin D and calcium supplements. -Encourage strength training exercises. -Repeat bone density scan in two years I.e 10/26

## 2023-06-22 NOTE — Assessment & Plan Note (Addendum)
 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   Completed radiation therapy and started on Letrozole .  Experiencing nausea due to Letrozole . No joint pains reported.  Reports some night sweats but not severe. Last mammo: 3/25  -Continue Zofran  for nausea as needed. -Continue Letrozole  and monitor for side effects.  -Consider SSRI for hot flashes if they become disruptive to daily activities. - Repeat mammogram 3/26  Follow-up in three months with labs to monitor liver enzymes.

## 2023-06-22 NOTE — Progress Notes (Signed)
 Patient Care Team: Elester Grim, MD as PCP - General (Internal Medicine) Eduardo Grade, MD as Medical Oncologist (Oncology) Gerhard Knuckles, RN as Oncology Nurse Navigator (Medical Oncology) Colie Dawes, MD as Attending Physician (Radiation Oncology)  Clinic Day:  06/22/2023  Referring physician: Elester Grim, MD   CHIEF COMPLAINT:  CC: DCIS   ASSESSMENT & PLAN:   Assessment & Plan: KANIAH PIERCY  is a 58 y.o. female with DCIS s/p lumpectomy and RT. Currently on Letrozole .   Ductal carcinoma in situ (DCIS) of left breast Patient with left breast DCIS s/p lumpectomy 08/24.   Completed radiation therapy and started on Letrozole .  Experiencing nausea due to Letrozole . No joint pains reported.  Reports some night sweats but not severe. Last mammo: 3/25  -Continue Zofran  for nausea as needed. -Continue Letrozole  and monitor for side effects.  -Consider SSRI for hot flashes if they become disruptive to daily activities. - Repeat mammogram 3/26  Follow-up in three months with labs to monitor liver enzymes.  Elevated LFTs Mildly elevated, possibly due to Letrozole  and alcohol use.   Trended down previously since cutting down alcohol.   No abdominal pain reported.  -Continue Letrozole  and monitor liver enzymes. -Recommended to cut down alcohol  Breast lump Breast lump palpated underneath the lumpectomy scar.  Likely scar tissue. US : Not concerning for breast carcinoma  -I will reach out to patient surgeon Dr. Delane Fear for further recommendations and if he can see her for reassurance. -Will repeat breast imaging to confirm  Osteopenia Detected on recent bone density scan (10/24). No symptoms reported.  -Continue Vitamin D and calcium supplements. -Encourage strength training exercises. -Repeat bone density scan in two years I.e 10/26   The patient understands the plans discussed today and is in agreement with them.  She knows to contact our office if  she develops concerns prior to her next appointment.  I provided 30 minutes of face-to-face time during this encounter and > 50% was spent counseling as documented under my assessment and plan.    Eduardo Grade, MD  [facility] [address] [place] [address] Dept: [phone] Dept Fax: [phone]   Orders Placed This Encounter  Procedures   CBC with Differential/Platelet    Standing Status:   Future    Expected Date:   09/18/2023    Expiration Date:   06/21/2024   Comprehensive metabolic panel with GFR    Standing Status:   Future    Expected Date:   09/18/2023    Expiration Date:   06/21/2024     ONCOLOGY HISTORY:   Oncology History  Ductal carcinoma in situ (DCIS) of left breast  09/13/2022 Mammogram   FINDINGS: In the left breast, calcifications warrant further evaluation. In the right breast, no findings suspicious for malignancy.   09/21/2022 Pathology Results   Left breast core needle biopsy:  DCIS, intermediate nuclear grade, solid and cribriform types without necrosis Negative for invasive carcinoma Microcalcifications present within DCIS DCIS measures 5 mm in greatest linear extent   10/10/2022 Genetic Testing   BRCA1/2 analysis with [employer]: Negative BARD1, DICER1: Variants of unknown significance identified   10/11/2022 Initial Diagnosis   Ductal carcinoma in situ (DCIS) of left breast   10/18/2022 Pathology Results   FINAL MICROSCOPIC DIAGNOSIS:   A. BREAST, LEFT, LUMPECTOMY:  Focal residual ductal carcinoma in situ, type solid, nuclear grade 2 of  3, without necrosis  DCIS greatest dimension: 2 mm  Margins:  negative      Closest margin: all >10 mm  Prognostic markers: ER Positive, PR positive  Biopsy site and clip  Other findings: N/A     10/18/2022 Procedure   Left breast lumpectomy   10/18/2022 Cancer Staging   Staging form: Breast, AJCC 8th Edition - Clinical stage from 10/18/2022:  Stage 0 (cTis (DCIS), cN0, cM0, ER+, PR+, HER2: Not Assessed) - Signed by Eduardo Grade, MD on 03/23/2023 Nuclear grade: G2   11/25/2022 -  Radiation Therapy   RT completed 11/25/22 to 12/20/22.   02/15/2023 -  Chemotherapy   Started Letrozole  2.5mg  twice daily       Current Treatment:  Letrozole   INTERVAL HISTORY:  LYRICAL CHADDERDON is here today for follow up.  She experiences hot flashes with a severity of 3 out of 10, which are manageable and do not interfere with daily activities. She is currently taking letrozole  and reports occasional nausea, particularly if she does not eat enough when taking the medication. She uses Zofran  for nausea approximately once a month.  She recently underwent a colonoscopy, which showed no polyps.  Her liver enzymes are slightly elevated and patient reports increased alcohol consumption since the last visit. She denies any arthralgias.  She is taking vitamin D and calcium supplement.  She continues to have the right breast pain at the lumpectomy site but her recent mammogram and ultrasound have been reassuring.  She continues to lose weight and is excited to be back on weight watchers program.  ALLERGIES:  is allergic to erythromycin, levofloxacin , penicillins, sulfamethoxazole-trimethoprim, and sulfa antibiotics.  MEDICATIONS:  Current Outpatient Medications  Medication Sig Dispense Refill   ALPRAZolam (XANAX) 0.25 MG tablet Take 0.25 mg by mouth at bedtime as needed for anxiety.     Calcium Carbonate (CALCIUM 500 PO) Take by mouth.     cetirizine (ZYRTEC) 10 MG tablet Take 10 mg by mouth daily.     cycloSPORINE (VEVYE) 0.1 % SOLN Apply 1 drop to eye daily.     escitalopram (LEXAPRO) 20 MG tablet      Glucosamine 750 MG TABS Take by mouth.     hydrocortisone (ANUSOL-HC) 2.5 % rectal cream 1 application Externally Twice a day for 30 days     ibuprofen  (ADVIL ) 800 MG tablet Take 1 tablet (800 mg total) by mouth 3 (three) times daily. 21 tablet 0    letrozole  (FEMARA ) 2.5 MG tablet TAKE 1 TABLET BY MOUTH DAILY 90 tablet 3   Multiple Vitamin (MULTIVITAMIN) capsule Take 1 capsule by mouth daily.     ondansetron  (ZOFRAN ) 8 MG tablet Take 1 tablet (8 mg total) by mouth every 8 (eight) hours as needed for nausea. 30 tablet 3   pantoprazole (PROTONIX) 40 MG tablet Take 40 mg by mouth 2 (two) times daily.     valACYclovir (VALTREX) 500 MG tablet      No current facility-administered medications for this visit.   REVIEW OF SYSTEMS:   Constitutional: Denies fevers, chills or abnormal weight loss Eyes: Denies blurriness of vision Ears, nose, mouth, throat, and face: Denies mucositis or sore throat Respiratory: Denies cough, dyspnea or wheezes Cardiovascular: Denies palpitation, chest discomfort or lower extremity swelling Gastrointestinal:  Denies nausea, heartburn or change in bowel habits Skin: Denies abnormal skin rashes Lymphatics: Denies new lymphadenopathy or easy bruising Neurological:Denies numbness, tingling or new weaknesses Behavioral/Psych: Mood is stable, no new changes  All other systems were reviewed with the patient and are negative.   VITALS:  Blood pressure 133/88, pulse 74, temperature 98.2 F (36.8 C), temperature source Oral, resp. rate 18, height 5' (1.524 m), weight 154 lb 12.8 oz (70.2 kg), last menstrual period 10/17/2014, SpO2 95%.  Wt Readings from Last 3 Encounters:  06/22/23 154 lb 12.8 oz (70.2 kg)  03/23/23 162 lb 14.7 oz (73.9 kg)  12/21/22 174 lb 12.8 oz (79.3 kg)    Body mass index is 30.23 kg/m.  Performance status (ECOG): 0 - Asymptomatic  PHYSICAL EXAM:   GENERAL:alert, no distress and comfortable SKIN: skin color, texture, turgor are normal, thematous rash over her right breast and her right axilla  BREAST:Left breast: Area consistent with scar tissue at site of previous surgery, tender to palpation, 3 cm into 1 cm mass palpated. Soreness on medial side, possibly related to radiation. Right  breast: No masses palpated, non tender.  LUNGS: clear to auscultation and percussion with normal breathing effort HEART: regular rate & rhythm and no murmurs and no lower extremity edema ABDOMEN:abdomen soft, non-tender and normal bowel sounds Musculoskeletal:no cyanosis of digits and no clubbing  NEURO: alert & oriented x 3 with fluent speech  LABORATORY DATA:  I have reviewed the data as listed    Component Value Date/Time   NA 139 06/15/2023 0943   K 3.7 06/15/2023 0943   CL 102 06/15/2023 0943   CO2 24 06/15/2023 0943   GLUCOSE 77 06/15/2023 0943   BUN 11 06/15/2023 0943   CREATININE 0.62 06/15/2023 0943   CALCIUM 9.6 06/15/2023 0943   PROT 7.5 06/15/2023 0943   ALBUMIN 4.3 06/15/2023 0943   AST 42 (H) 06/15/2023 0943   ALT 54 (H) 06/15/2023 0943   ALKPHOS 70 06/15/2023 0943   BILITOT 0.7 06/15/2023 0943   GFRNONAA >60 06/15/2023 0943   GFRAA  01/08/2007 1009    >60        The eGFR has been calculated using the MDRD equation. This calculation has not been validated in all clinical   Lab Results  Component Value Date   WBC 4.9 06/15/2023   NEUTROABS 2.7 06/15/2023   HGB 15.1 (H) 06/15/2023   HCT 44.0 06/15/2023   MCV 94.0 06/15/2023   PLT 225 06/15/2023      Chemistry      Component Value Date/Time   NA 139 06/15/2023 0943   K 3.7 06/15/2023 0943   CL 102 06/15/2023 0943   CO2 24 06/15/2023 0943   BUN 11 06/15/2023 0943   CREATININE 0.62 06/15/2023 0943      Component Value Date/Time   CALCIUM 9.6 06/15/2023 0943   ALKPHOS 70 06/15/2023 0943   AST 42 (H) 06/15/2023 0943   ALT 54 (H) 06/15/2023 0943   BILITOT 0.7 06/15/2023 0943     RADIOLOGICAL IMAGES:   US  Breast Limited Uni Left Inc Axilla CLINICAL DATA:  58 year old female with history of left breast lumpectomy in August of 2024. She noticed a palpable lump at her lumpectomy site in October, which is intermittently tender. She also has tenderness in her left axilla. In January of 2025 she  noticed a palpable lump in the medial left breast.  EXAM: DIGITAL DIAGNOSTIC UNILATERAL LEFT MAMMOGRAM WITH TOMOSYNTHESIS AND CAD; ULTRASOUND LEFT BREAST LIMITED  TECHNIQUE: Left digital diagnostic mammography and breast tomosynthesis was performed. The images were evaluated with computer-aided detection. ; Targeted ultrasound examination of the left breast was performed.  COMPARISON:  Previous exam(s).  ACR Breast Density Category a: The breasts are almost entirely fatty.  FINDINGS: BBs indicating the  palpable sites of concern have been placed on the lower inner left breast and in the upper outer left breast at the patient's lumpectomy site. No suspicious findings are identified deep to these markers. Expected surgical changes noted in the upper-outer left breast consistent with history of lumpectomy. No suspicious calcifications, masses or areas of distortion are seen in the left breast.  Ultrasound targeted to the palpable site of concern in the lower-inner left breast at 8 o'clock, 11 cm from the nipple demonstrates normal fibroglandular tissue. At the palpable site in the lumpectomy site at 3 o'clock, 10 cm from the nipple there is a complex mass measuring 2.1 x 1.3 x 1.8 cm. There are fluid pockets within the mass, and a tract extending to the skin surface to the location of the surgical excision. This is consistent with the lumpectomy site seen mammographically. Ultrasound of the left axilla demonstrates normal subcutaneous tissue. No suspicious masses are identified.  IMPRESSION: 1. There are no suspicious mammographic or targeted sonographic abnormalities at the palpable tender areas of concern in the left breast. Expected surgical changes at the left breast lumpectomy site.  RECOMMENDATION: 1. Clinical follow-up recommended for the palpable and tender areas of concern in the left breast and axilla. Any further workup should be based on clinical grounds.  2.  Routine annual post lumpectomy diagnostic mammogram recommended in July of 2025.  I have discussed the findings and recommendations with the patient. If applicable, a reminder letter will be sent to the patient regarding the next appointment.  BI-RADS CATEGORY  2: Benign.  Electronically Signed   By: Alinda Apley M.D.   On: 05/18/2023 10:05 MM 3D DIAGNOSTIC MAMMOGRAM UNILATERAL LEFT BREAST CLINICAL DATA:  58 year old female with history of left breast lumpectomy in August of 2024. She noticed a palpable lump at her lumpectomy site in October, which is intermittently tender. She also has tenderness in her left axilla. In January of 2025 she noticed a palpable lump in the medial left breast.  EXAM: DIGITAL DIAGNOSTIC UNILATERAL LEFT MAMMOGRAM WITH TOMOSYNTHESIS AND CAD; ULTRASOUND LEFT BREAST LIMITED  TECHNIQUE: Left digital diagnostic mammography and breast tomosynthesis was performed. The images were evaluated with computer-aided detection. ; Targeted ultrasound examination of the left breast was performed.  COMPARISON:  Previous exam(s).  ACR Breast Density Category a: The breasts are almost entirely fatty.  FINDINGS: BBs indicating the palpable sites of concern have been placed on the lower inner left breast and in the upper outer left breast at the patient's lumpectomy site. No suspicious findings are identified deep to these markers. Expected surgical changes noted in the upper-outer left breast consistent with history of lumpectomy. No suspicious calcifications, masses or areas of distortion are seen in the left breast.  Ultrasound targeted to the palpable site of concern in the lower-inner left breast at 8 o'clock, 11 cm from the nipple demonstrates normal fibroglandular tissue. At the palpable site in the lumpectomy site at 3 o'clock, 10 cm from the nipple there is a complex mass measuring 2.1 x 1.3 x 1.8 cm. There are fluid pockets within the mass, and a  tract extending to the skin surface to the location of the surgical excision. This is consistent with the lumpectomy site seen mammographically. Ultrasound of the left axilla demonstrates normal subcutaneous tissue. No suspicious masses are identified.  IMPRESSION: 1. There are no suspicious mammographic or targeted sonographic abnormalities at the palpable tender areas of concern in the left breast. Expected surgical changes at the left breast lumpectomy  site.  RECOMMENDATION: 1. Clinical follow-up recommended for the palpable and tender areas of concern in the left breast and axilla. Any further workup should be based on clinical grounds.  2. Routine annual post lumpectomy diagnostic mammogram recommended in July of 2025.  I have discussed the findings and recommendations with the patient. If applicable, a reminder letter will be sent to the patient regarding the next appointment.  BI-RADS CATEGORY  2: Benign.  Electronically Signed   By: Alinda Apley M.D.   On: 05/18/2023 10:05

## 2023-06-27 NOTE — Progress Notes (Signed)
 PROVIDER:  DONNICE CARLIN BURY, MD  MRN: [id number] DOB: 1965/10/30 DATE OF ENCOUNTER: 06/27/2023 Subjective      Chief Complaint: Follow-up     History of Present Illness: 58 year old female who underwent a left breast lumpectomy 8/24. Pathology showed residual grade 2 DCIS that measures 2 mm. Her margins were all well clear. This is ER positive at 100% and PR +50%. She did well after surgery.  She underwent radiotherapy with a boost. She is on letrozole . She has had pain in left breast. She has recent mm and us  ordered by oncology that is really normal.  There is area that looks like seroma at lumpectomy.  She has had pain since surgery and then radiotherapy. It is getting better.    Medical History: Past Medical History:  Diagnosis Date  . Anxiety   . GERD (gastroesophageal reflux disease)   . History of cancer     Patient Active Problem List  Diagnosis  . Ductal carcinoma in situ (DCIS) of left breast    Past Surgical History:  Procedure Laterality Date  . Gallstones removed    . LAPAROSCOPIC TUBAL LIGATION    . MASTECTOMY PARTIAL / LUMPECTOMY Left   . TONSILLECTOMY       Allergies  Allergen Reactions  . Levofloxacin  Other (See Comments)    Severe body aches  . Penicillins Unknown and Other (See Comments)    Unknown Childhood reaction  . Sulfa (Sulfonamide Antibiotics) Rash and Swelling    Current Outpatient Medications on File Prior to Visit  Medication Sig Dispense Refill  . ALPRAZolam (XANAX) 0.25 MG tablet 1 tablet Orally Twice a day as needed for anxiety    . calcium carbonate-vitamin D3 500 mg-3.125 mcg (125 unit) per tablet Take by mouth    . escitalopram oxalate (LEXAPRO) 20 MG tablet     . multivitamin with minerals Cap Take 1 capsule by mouth once daily    . pantoprazole (PROTONIX) 40 MG DR tablet      No current facility-administered medications on file prior to visit.    Family History  Problem Relation Age of Onset  . Breast cancer  Maternal Grandmother      Social History   Tobacco Use  Smoking Status Former  . Types: Cigarettes  . Start date: 1995  Smokeless Tobacco Never     Social History   Socioeconomic History  . Marital status: Divorced  Tobacco Use  . Smoking status: Former    Types: Cigarettes    Start date: 1995  . Smokeless tobacco: Never  Vaping Use  . Vaping status: Never Used  Substance and Sexual Activity  . Alcohol use: Yes  . Drug use: Never   Social Drivers of Health   Food Insecurity: No Food Insecurity (11/08/2022)   [facility]   Hunger Vital Sign   . Worried About Programme researcher, broadcasting/film/video in the Last Year: Never true   . Ran Out of Food in the Last Year: Never true  Transportation Needs: No Transportation Needs (11/08/2022)   Received from [employer] - Transportation   . Lack of Transportation (Medical): No   . Lack of Transportation (Non-Medical): No  Housing Stability: Unknown (06/27/2023)   Housing Stability Vital Sign   . Homeless in the Last Year: No    Objective:    Vitals:   06/27/23 1616  PainSc: 0-No pain    There is no height or weight on file to calculate BMI.  Physical  Exam   Left breast with tenderness throughout mostly in area of scar.  There is palpable area corresponding to benign us  finding that is tender.     Assessment and Plan:     Diagnoses and all orders for this visit:  Ductal carcinoma in situ (DCIS) of left breast  I do not think she has any recurrence of her cancer historically this is occurred since her surgery and was worsened with radiotherapy.  Her arm is also fairly tight and we are going to give her some exercises today.  If that does not get better asked her to call me back and I will refer her to see physical therapy.  I am going to plan on seeing her again in August but reassured her today that I think this is benign and it should just continue to get better over time.     MATTHEW CARLIN BURY, MD

## 2023-09-13 ENCOUNTER — Other Ambulatory Visit: Payer: Self-pay | Admitting: Oncology

## 2023-09-13 DIAGNOSIS — Z9889 Other specified postprocedural states: Secondary | ICD-10-CM

## 2023-09-22 ENCOUNTER — Inpatient Hospital Stay: Attending: Oncology

## 2023-09-22 DIAGNOSIS — Z923 Personal history of irradiation: Secondary | ICD-10-CM | POA: Diagnosis not present

## 2023-09-22 DIAGNOSIS — Z170 Estrogen receptor positive status [ER+]: Secondary | ICD-10-CM | POA: Insufficient documentation

## 2023-09-22 DIAGNOSIS — R7989 Other specified abnormal findings of blood chemistry: Secondary | ICD-10-CM

## 2023-09-22 DIAGNOSIS — Z1721 Progesterone receptor positive status: Secondary | ICD-10-CM | POA: Insufficient documentation

## 2023-09-22 DIAGNOSIS — D0512 Intraductal carcinoma in situ of left breast: Secondary | ICD-10-CM | POA: Insufficient documentation

## 2023-09-22 DIAGNOSIS — Z79811 Long term (current) use of aromatase inhibitors: Secondary | ICD-10-CM | POA: Insufficient documentation

## 2023-09-22 DIAGNOSIS — M85852 Other specified disorders of bone density and structure, left thigh: Secondary | ICD-10-CM | POA: Insufficient documentation

## 2023-09-22 DIAGNOSIS — Z79899 Other long term (current) drug therapy: Secondary | ICD-10-CM | POA: Diagnosis not present

## 2023-09-22 LAB — CBC WITH DIFFERENTIAL/PLATELET
Abs Immature Granulocytes: 0.03 K/uL (ref 0.00–0.07)
Basophils Absolute: 0.1 K/uL (ref 0.0–0.1)
Basophils Relative: 1 %
Eosinophils Absolute: 0.1 K/uL (ref 0.0–0.5)
Eosinophils Relative: 2 %
HCT: 45.3 % (ref 36.0–46.0)
Hemoglobin: 15.6 g/dL — ABNORMAL HIGH (ref 12.0–15.0)
Immature Granulocytes: 1 %
Lymphocytes Relative: 32 %
Lymphs Abs: 1.4 K/uL (ref 0.7–4.0)
MCH: 33.5 pg (ref 26.0–34.0)
MCHC: 34.4 g/dL (ref 30.0–36.0)
MCV: 97.2 fL (ref 80.0–100.0)
Monocytes Absolute: 0.5 K/uL (ref 0.1–1.0)
Monocytes Relative: 11 %
Neutro Abs: 2.3 K/uL (ref 1.7–7.7)
Neutrophils Relative %: 53 %
Platelets: 223 K/uL (ref 150–400)
RBC: 4.66 MIL/uL (ref 3.87–5.11)
RDW: 12.6 % (ref 11.5–15.5)
WBC: 4.3 K/uL (ref 4.0–10.5)
nRBC: 0 % (ref 0.0–0.2)

## 2023-09-22 LAB — COMPREHENSIVE METABOLIC PANEL WITH GFR
ALT: 64 U/L — ABNORMAL HIGH (ref 0–44)
AST: 47 U/L — ABNORMAL HIGH (ref 15–41)
Albumin: 4.3 g/dL (ref 3.5–5.0)
Alkaline Phosphatase: 68 U/L (ref 38–126)
Anion gap: 13 (ref 5–15)
BUN: 11 mg/dL (ref 6–20)
CO2: 23 mmol/L (ref 22–32)
Calcium: 9.8 mg/dL (ref 8.9–10.3)
Chloride: 106 mmol/L (ref 98–111)
Creatinine, Ser: 0.77 mg/dL (ref 0.44–1.00)
GFR, Estimated: >60 mL/min (ref >60)
Glucose, Bld: 85 mg/dL (ref 70–99)
Potassium: 3.6 mmol/L (ref 3.5–5.1)
Sodium: 142 mmol/L (ref 135–145)
Total Bilirubin: 0.7 mg/dL (ref 0.0–1.2)
Total Protein: 7.6 g/dL (ref 6.5–8.1)

## 2023-09-29 ENCOUNTER — Inpatient Hospital Stay: Admitting: Oncology

## 2023-09-29 VITALS — BP 113/81 | HR 78 | Temp 98.1°F | Resp 16 | Wt 147.0 lb

## 2023-09-29 DIAGNOSIS — M85852 Other specified disorders of bone density and structure, left thigh: Secondary | ICD-10-CM | POA: Diagnosis not present

## 2023-09-29 DIAGNOSIS — R110 Nausea: Secondary | ICD-10-CM | POA: Diagnosis not present

## 2023-09-29 DIAGNOSIS — R7989 Other specified abnormal findings of blood chemistry: Secondary | ICD-10-CM

## 2023-09-29 DIAGNOSIS — D0512 Intraductal carcinoma in situ of left breast: Secondary | ICD-10-CM

## 2023-09-29 MED ORDER — LETROZOLE 2.5 MG PO TABS: 2.5000 mg | ORAL_TABLET | Freq: Every day | ORAL | 3 refills | Status: AC

## 2023-09-29 MED ORDER — ONDANSETRON HCL 8 MG PO TABS: 8.0000 mg | ORAL_TABLET | Freq: Three times a day (TID) | ORAL | 3 refills | Status: AC | PRN

## 2023-09-29 NOTE — Assessment & Plan Note (Addendum)
 Mildly elevated, possibly due to Letrozole  and alcohol use.  Slightly elevated even before the start of letrozole  Trended down previously since cutting down alcohol.   No abdominal pain reported. Labs reviewed today, stable  -Continue Letrozole  and monitor liver enzymes. -Encouraged to limit alcohol intake -Will consider getting US  of abdomen and GI referral if worsens

## 2023-09-29 NOTE — Progress Notes (Signed)
 Patient Care Team: Vernon Velna SAUNDERS, MD as PCP - General (Internal Medicine) Davonna Siad, MD as Medical Oncologist (Oncology) Celestia Joesph SQUIBB, RN as Oncology Nurse Navigator (Medical Oncology) Izell Domino, MD as Attending Physician (Radiation Oncology)  Clinic Day:  09/29/2023  Referring physician: Vernon Velna SAUNDERS, MD   CHIEF COMPLAINT:  CC: DCIS   ASSESSMENT & PLAN:   Assessment & Plan: [name]  is a 59 y.o. female with DCIS s/p lumpectomy and RT. Currently on Letrozole .   Assessment & Plan Ductal carcinoma in situ (DCIS) of left breast Patient with left breast DCIS s/p lumpectomy 08/24.   Completed radiation therapy 10/24 and started on Letrozole  10/24.  Experiencing nausea due to Letrozole . No joint pains reported.  Reports some night sweats but not severe. Last mammo: 7/24, Repeat diagnostic of just left in 03/25  -Continue Zofran  for nausea as needed. -Continue Letrozole  2.5 mg daily and monitor for side effects.  -Consider SSRI for hot flashes if they become disruptive to daily activities. - Repeat mammogram 8/25- scheduled  Follow-up in three months with labs to monitor liver enzymes. Elevated LFTs Mildly elevated, possibly due to Letrozole  and alcohol use.  Slightly elevated even before the start of letrozole  Trended down previously since cutting down alcohol.   No abdominal pain reported. Labs reviewed today, stable  -Continue Letrozole  and monitor liver enzymes. -Encouraged to limit alcohol intake -Will consider getting US  of abdomen and GI referral if worsens Osteopenia of neck of left femur Detected on recent bone density scan (10/24). No symptoms reported.  -Continue Vitamin D and calcium supplements. -Encourage strength training exercises. -Repeat bone density scan in two years I.e 10/26 Nausea without vomiting Likely secondary to letrozole  use  - Continue Zofran  8 mg every 8 hours as needed  The patient understands the plans  discussed today and is in agreement with them.  She knows to contact our office if she develops concerns prior to her next appointment.  I provided 20 minutes of face-to-face time during this encounter and > 50% was spent counseling as documented under my assessment and plan.    Siad Davonna, MD  [facility] [address] [place] [address] Dept: [phone] Dept Fax: [phone]   Orders Placed This Encounter  Procedures   Comprehensive metabolic panel with GFR    Standing Status:   Standing    Number of Occurrences:   33    Expiration Date:   12/30/2023     ONCOLOGY HISTORY:   Oncology History  Ductal carcinoma in situ (DCIS) of left breast  09/13/2022 Mammogram   FINDINGS: In the left breast, calcifications warrant further evaluation. In the right breast, no findings suspicious for malignancy.   09/21/2022 Pathology Results   Left breast core needle biopsy:  DCIS, intermediate nuclear grade, solid and cribriform types without necrosis Negative for invasive carcinoma Microcalcifications present within DCIS DCIS measures 5 mm in greatest linear extent   10/10/2022 Genetic Testing   BRCA1/2 analysis with CancerNext: Negative BARD1, DICER1: Variants of unknown significance identified   10/11/2022 Initial Diagnosis   Ductal carcinoma in situ (DCIS) of left breast   10/18/2022 Pathology Results   FINAL MICROSCOPIC DIAGNOSIS:   A. BREAST, LEFT, LUMPECTOMY:  Focal residual ductal carcinoma in situ, type solid, nuclear grade 2 of  3, without necrosis  DCIS greatest dimension: 2 mm  Margins:  negative      Closest margin: all >10 mm  Prognostic markers:  ER Positive, PR positive  Biopsy site and clip  Other findings: N/A     10/18/2022 Procedure   Left breast lumpectomy   10/18/2022 Cancer Staging   Staging form: Breast, AJCC 8th Edition - Clinical stage from 10/18/2022: Stage 0 (cTis (DCIS), cN0, cM0, ER+,  PR+, HER2: Not Assessed) - Signed by Davonna Siad, MD on 03/23/2023 Nuclear grade: G2   11/25/2022 -  Radiation Therapy   RT completed 11/25/22 to 12/20/22.   11/25/2022 - 12/20/2022 Radiation Therapy   Breast RT at [facility] in [place]   02/15/2023 -  Chemotherapy   Started Letrozole  2.5mg  twice daily       Current Treatment:  Letrozole   INTERVAL HISTORY:  [name] is here today for follow up.  She experiences hot flashes ocassionally which are manageable and do not interfere with daily activities. She is currently taking letrozole  and reports occasional nausea.  She uses Zofran  for nausea approximately once a month.  Patient has significantly cut down alcohol consumption. Has been going to gym.  She denies any arthralgias.  She is taking vitamin D and calcium supplement.  Left breast pain has resolved.    ALLERGIES:  is allergic to erythromycin, levofloxacin , penicillins, sulfamethoxazole-trimethoprim, and sulfa antibiotics.  MEDICATIONS:  Current Outpatient Medications  Medication Sig Dispense Refill   ALPRAZolam (XANAX) 0.25 MG tablet Take 0.25 mg by mouth at bedtime as needed for anxiety.     Calcium Carbonate (CALCIUM 500 PO) Take by mouth.     cetirizine (ZYRTEC) 10 MG tablet Take 10 mg by mouth daily.     cycloSPORINE (VEVYE) 0.1 % SOLN Apply 1 drop to eye daily.     escitalopram (LEXAPRO) 20 MG tablet      Glucosamine 750 MG TABS Take by mouth.     hydrocortisone (ANUSOL-HC) 2.5 % rectal cream 1 application Externally Twice a day for 30 days     ibuprofen  (ADVIL ) 800 MG tablet Take 1 tablet (800 mg total) by mouth 3 (three) times daily. 21 tablet 0   Multiple Vitamin (MULTIVITAMIN) capsule Take 1 capsule by mouth daily.     pantoprazole (PROTONIX) 40 MG tablet Take 40 mg by mouth 2 (two) times daily.     valACYclovir (VALTREX) 500 MG tablet      letrozole  (FEMARA ) 2.5 MG tablet Take 1 tablet (2.5 mg total) by mouth daily. 90 tablet 3   ondansetron   (ZOFRAN ) 8 MG tablet Take 1 tablet (8 mg total) by mouth every 8 (eight) hours as needed for nausea. 90 tablet 3   No current facility-administered medications for this visit.   REVIEW OF SYSTEMS:   Constitutional: Denies fevers, chills or abnormal weight loss Eyes: Denies blurriness of vision Ears, nose, mouth, throat, and face: Denies mucositis or sore throat Respiratory: Denies cough, dyspnea or wheezes Cardiovascular: Denies palpitation, chest discomfort or lower extremity swelling Gastrointestinal:  Denies nausea, heartburn or change in bowel habits Skin: Denies abnormal skin rashes Lymphatics: Denies new lymphadenopathy or easy bruising Neurological:Denies numbness, tingling or new weaknesses Behavioral/Psych: Mood is stable, no new changes  All other systems were reviewed with the patient and are negative.   VITALS:  Blood pressure 113/81, pulse 78, temperature 98.1 F (36.7 C), temperature source Oral, resp. rate 16, weight 147 lb 0.8 oz (66.7 kg), last menstrual period 10/17/2014, SpO2 97%.  Wt Readings from Last 3 Encounters:  09/29/23 147 lb 0.8 oz (66.7 kg)  06/22/23 154 lb 12.8 oz (70.2 kg)  03/23/23 162 lb 14.7  oz (73.9 kg)    Body mass index is 28.72 kg/m.  Performance status (ECOG): 0 - Asymptomatic  PHYSICAL EXAM:   GENERAL:alert, no distress and comfortable SKIN: skin color, texture, turgor are normal, thematous rash over her right breast and her right axilla  BREAST:Left breast: Exam normal. No lumps palpated,  Right breast: No masses palpated, non tender.  LUNGS: clear to auscultation and percussion with normal breathing effort HEART: regular rate & rhythm and no murmurs and no lower extremity edema ABDOMEN:abdomen soft, non-tender and normal bowel sounds Musculoskeletal:no cyanosis of digits and no clubbing  NEURO: alert & oriented x 3 with fluent speech  LABORATORY DATA:  I have reviewed the data as listed    Component Value Date/Time   NA 142  09/22/2023 0845   K 3.6 09/22/2023 0845   CL 106 09/22/2023 0845   CO2 23 09/22/2023 0845   GLUCOSE 85 09/22/2023 0845   BUN 11 09/22/2023 0845   CREATININE 0.77 09/22/2023 0845   CALCIUM 9.8 09/22/2023 0845   PROT 7.6 09/22/2023 0845   ALBUMIN 4.3 09/22/2023 0845   AST 47 (H) 09/22/2023 0845   ALT 64 (H) 09/22/2023 0845   ALKPHOS 68 09/22/2023 0845   BILITOT 0.7 09/22/2023 0845   GFRNONAA >60 09/22/2023 0845   GFRAA  01/08/2007 1009    >60        The eGFR has been calculated using the MDRD equation. This calculation has not been validated in all clinical   Lab Results  Component Value Date   WBC 4.3 09/22/2023   NEUTROABS 2.3 09/22/2023   HGB 15.6 (H) 09/22/2023   HCT 45.3 09/22/2023   MCV 97.2 09/22/2023   PLT 223 09/22/2023      Chemistry      Component Value Date/Time   NA 142 09/22/2023 0845   K 3.6 09/22/2023 0845   CL 106 09/22/2023 0845   CO2 23 09/22/2023 0845   BUN 11 09/22/2023 0845   CREATININE 0.77 09/22/2023 0845      Component Value Date/Time   CALCIUM 9.8 09/22/2023 0845   ALKPHOS 68 09/22/2023 0845   AST 47 (H) 09/22/2023 0845   ALT 64 (H) 09/22/2023 0845   BILITOT 0.7 09/22/2023 0845     RADIOLOGICAL IMAGES:   None new to review

## 2023-09-29 NOTE — Assessment & Plan Note (Addendum)
 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   Completed radiation therapy 10/24 and started on Letrozole  10/24.  Experiencing nausea due to Letrozole . No joint pains reported.  Reports some night sweats but not severe. Last mammo: 7/24, Repeat diagnostic of just left in 03/25  -Continue Zofran  for nausea as needed. -Continue Letrozole  2.5 mg daily and monitor for side effects.  -Consider SSRI for hot flashes if they become disruptive to daily activities. - Repeat mammogram 8/25- scheduled  Follow-up in three months with labs to monitor liver enzymes.

## 2023-09-29 NOTE — Assessment & Plan Note (Addendum)
 Detected on recent bone density scan (10/24). No symptoms reported.  -Continue Vitamin D and calcium supplements. -Encourage strength training exercises. -Repeat bone density scan in two years I.e 10/26

## 2023-09-29 NOTE — Assessment & Plan Note (Signed)
 Likely secondary to letrozole  use  - Continue Zofran  8 mg every 8 hours as needed

## 2023-10-06 ENCOUNTER — Ambulatory Visit
Admission: RE | Admit: 2023-10-06 | Discharge: 2023-10-06 | Disposition: A | Discharge status: Home / Self Care | Source: Ambulatory Visit | Attending: Oncology | Admitting: Oncology

## 2023-10-06 DIAGNOSIS — Z9889 Other specified postprocedural states: Secondary | ICD-10-CM

## 2023-11-03 ENCOUNTER — Encounter

## 2023-12-21 ENCOUNTER — Other Ambulatory Visit: Payer: Self-pay

## 2023-12-21 DIAGNOSIS — R7989 Other specified abnormal findings of blood chemistry: Secondary | ICD-10-CM

## 2023-12-21 DIAGNOSIS — D0512 Intraductal carcinoma in situ of left breast: Secondary | ICD-10-CM

## 2023-12-22 ENCOUNTER — Inpatient Hospital Stay: Attending: Oncology

## 2023-12-22 DIAGNOSIS — R7989 Other specified abnormal findings of blood chemistry: Secondary | ICD-10-CM

## 2023-12-22 DIAGNOSIS — D0512 Intraductal carcinoma in situ of left breast: Secondary | ICD-10-CM | POA: Diagnosis present

## 2023-12-22 LAB — COMPREHENSIVE METABOLIC PANEL WITH GFR
ALT: 38 U/L (ref 0–44)
AST: 27 U/L (ref 15–41)
Albumin: 4.4 g/dL (ref 3.5–5.0)
Alkaline Phosphatase: 79 U/L (ref 38–126)
Anion gap: 11 (ref 5–15)
BUN: 10 mg/dL (ref 6–20)
CO2: 26 mmol/L (ref 22–32)
Calcium: 9.6 mg/dL (ref 8.9–10.3)
Chloride: 102 mmol/L (ref 98–111)
Creatinine, Ser: 0.79 mg/dL (ref 0.44–1.00)
GFR, Estimated: >60 mL/min (ref >60)
Glucose, Bld: 93 mg/dL (ref 70–99)
Potassium: 4.1 mmol/L (ref 3.5–5.1)
Sodium: 139 mmol/L (ref 135–145)
Total Bilirubin: 0.5 mg/dL (ref 0.0–1.2)
Total Protein: 7.2 g/dL (ref 6.5–8.1)

## 2023-12-22 LAB — CBC WITH DIFFERENTIAL/PLATELET
Abs Immature Granulocytes: 0.01 K/uL (ref 0.00–0.07)
Basophils Absolute: 0.1 K/uL (ref 0.0–0.1)
Basophils Relative: 1 %
Eosinophils Absolute: 0.1 K/uL (ref 0.0–0.5)
Eosinophils Relative: 3 %
HCT: 42.7 % (ref 36.0–46.0)
Hemoglobin: 14.8 g/dL (ref 12.0–15.0)
Immature Granulocytes: 0 %
Lymphocytes Relative: 25 %
Lymphs Abs: 1.3 K/uL (ref 0.7–4.0)
MCH: 32.9 pg (ref 26.0–34.0)
MCHC: 34.7 g/dL (ref 30.0–36.0)
MCV: 94.9 fL (ref 80.0–100.0)
Monocytes Absolute: 0.6 K/uL (ref 0.1–1.0)
Monocytes Relative: 12 %
Neutro Abs: 3.1 K/uL (ref 1.7–7.7)
Neutrophils Relative %: 59 %
Platelets: 231 K/uL (ref 150–400)
RBC: 4.5 MIL/uL (ref 3.87–5.11)
RDW: 12.9 % (ref 11.5–15.5)
WBC: 5.3 K/uL (ref 4.0–10.5)
nRBC: 0 % (ref 0.0–0.2)

## 2023-12-29 ENCOUNTER — Inpatient Hospital Stay: Attending: Oncology | Admitting: Oncology

## 2023-12-29 VITALS — BP 111/86 | HR 72 | Temp 98.1°F | Resp 16 | Wt 145.5 lb

## 2023-12-29 DIAGNOSIS — R7989 Other specified abnormal findings of blood chemistry: Secondary | ICD-10-CM | POA: Diagnosis not present

## 2023-12-29 DIAGNOSIS — M8580 Other specified disorders of bone density and structure, unspecified site: Secondary | ICD-10-CM | POA: Diagnosis not present

## 2023-12-29 DIAGNOSIS — Z79811 Long term (current) use of aromatase inhibitors: Secondary | ICD-10-CM | POA: Insufficient documentation

## 2023-12-29 DIAGNOSIS — D0512 Intraductal carcinoma in situ of left breast: Secondary | ICD-10-CM | POA: Diagnosis present

## 2023-12-29 DIAGNOSIS — Z923 Personal history of irradiation: Secondary | ICD-10-CM | POA: Diagnosis not present

## 2023-12-29 NOTE — Assessment & Plan Note (Addendum)
 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   Completed radiation therapy 10/24 and started on Letrozole  10/24.  Experiencing nausea due to Letrozole . No joint pains reported.  Reports some night sweats but not severe. Last mammo: 10/06/2023-postsurgical changes of upper outer left breast.  No evidence of right or left breast malignancy.  Repeat in 1 year. Reports she is taking her letrozole  in the morning.  We discussed taking it at bedtime to see if this changed side effects.  If not, take it whichever time you prefer.  We also discussed other aromatase inhibitors should she need to switch based on side effects.  -Continue Zofran  for nausea as needed. -Continue Letrozole  2.5 mg daily and monitor for side effects.  -Consider SSRI for hot flashes if they become disruptive to daily activities. - Repeat mammogram due in August 2026.  Follow-up in six months with labs.

## 2023-12-29 NOTE — Assessment & Plan Note (Addendum)
 Detected on recent bone density scan (10/24). No symptoms reported. Repeat bone density in October 2026.  -Continue Vitamin D and calcium supplements. -Encourage strength training exercises. -Repeat bone density scan in two years I.e 10/26

## 2023-12-29 NOTE — Progress Notes (Signed)
 Patient Care Team: Vernon Velna SAUNDERS, MD as PCP - General (Internal Medicine) Davonna Siad, MD as Medical Oncologist (Oncology) Celestia Joesph SQUIBB, RN as Oncology Nurse Navigator (Medical Oncology) Izell Domino, MD as Attending Physician (Radiation Oncology)  Clinic Day:  12/29/2023  Referring physician: Vernon Velna SAUNDERS, MD   CHIEF COMPLAINT:  CC: DCIS   ASSESSMENT & PLAN:   Assessment & Plan: [name]  is a 58 y.o. female with DCIS s/p lumpectomy and RT. Currently on Letrozole .   Assessment & Plan Ductal carcinoma in situ (DCIS) of left breast Patient with left breast DCIS s/p lumpectomy 08/24.   Completed radiation therapy 10/24 and started on Letrozole  10/24.  Experiencing nausea due to Letrozole . No joint pains reported.  Reports some night sweats but not severe. Last mammo: 10/06/2023-postsurgical changes of upper outer left breast.  No evidence of right or left breast malignancy.  Repeat in 1 year. Reports she is taking her letrozole  in the morning.  We discussed taking it at bedtime to see if this changed side effects.  If not, take it whichever time you prefer.  We also discussed other aromatase inhibitors should she need to switch based on side effects.  -Continue Zofran  for nausea as needed. -Continue Letrozole  2.5 mg daily and monitor for side effects.  -Consider SSRI for hot flashes if they become disruptive to daily activities. - Repeat mammogram due in August 2026.  Follow-up in six months with labs.  Elevated LFTs Mildly elevated, possibly due to Letrozole  and alcohol use.  Slightly elevated even before the start of letrozole  Trended down previously since cutting down alcohol.   No abdominal pain reported. Labs today show: Normal LFTs.  -Continue Letrozole . -Encouraged to limit alcohol intake - Given improvement of liver enzymes, patient does not need ultrasound or referral to GI. Osteopenia, unspecified location Detected on recent bone density  scan (10/24). No symptoms reported. Repeat bone density in October 2026.  -Continue Vitamin D and calcium supplements. -Encourage strength training exercises. -Repeat bone density scan in two years I.e 10/26  The patient understands the plans discussed today and is in agreement with them.  She knows to contact our office if she develops concerns prior to her next appointment.  I provided 20 minutes of face-to-face time during this encounter and > 50% was spent counseling as documented under my assessment and plan.    Delon FORBES Hope, NP  [facility] [address] [place] [address] Dept: [phone] Dept Fax: [phone]   Orders Placed This Encounter  Procedures   MM 3D DIAGNOSTIC MAMMOGRAM BILATERAL BREAST    INS:[employer] PF:09/13/2022 NO BRT ISSUES IMPLANTS OR REDUC:NO HX OF BREAST CANCER:YES NEEDS:NO PT AWARE OF 75 CX FEE-AC W PT    Standing Status:   Future    Expected Date:   09/30/2024    Expiration Date:   12/29/2024    Reason for Exam (SYMPTOM  OR DIAGNOSIS REQUIRED):   S/P    Is the patient pregnant?:   No    Preferred imaging location?:   [facility]   Comprehensive metabolic panel with GFR    Standing Status:   Future    Expected Date:   06/27/2024    Expiration Date:   09/25/2024   CBC with Differential    Standing Status:   Future    Expected Date:   06/27/2024    Expiration Date:   09/25/2024     ONCOLOGY HISTORY:  Oncology History  Ductal carcinoma in situ (DCIS) of left breast  09/13/2022 Mammogram   FINDINGS: In the left breast, calcifications warrant further evaluation. In the right breast, no findings suspicious for malignancy.   09/21/2022 Pathology Results   Left breast core needle biopsy:  DCIS, intermediate nuclear grade, solid and cribriform types without necrosis Negative for invasive carcinoma Microcalcifications present within DCIS DCIS measures 5 mm in greatest linear extent    10/10/2022 Genetic Testing   BRCA1/2 analysis with CancerNext: Negative BARD1, DICER1: Variants of unknown significance identified   10/11/2022 Initial Diagnosis   Ductal carcinoma in situ (DCIS) of left breast   10/18/2022 Pathology Results   FINAL MICROSCOPIC DIAGNOSIS:   A. BREAST, LEFT, LUMPECTOMY:  Focal residual ductal carcinoma in situ, type solid, nuclear grade 2 of  3, without necrosis  DCIS greatest dimension: 2 mm  Margins:  negative      Closest margin: all >10 mm  Prognostic markers: ER Positive, PR positive  Biopsy site and clip  Other findings: N/A     10/18/2022 Procedure   Left breast lumpectomy   10/18/2022 Cancer Staging   Staging form: Breast, AJCC 8th Edition - Clinical stage from 10/18/2022: Stage 0 (cTis (DCIS), cN0, cM0, ER+, PR+, HER2: Not Assessed) - Signed by Davonna Siad, MD on 03/23/2023 Nuclear grade: G2   11/25/2022 -  Radiation Therapy   RT completed 11/25/22 to 12/20/22.   11/25/2022 - 12/20/2022 Radiation Therapy   Breast RT at [facility] in [place]   02/15/2023 -  Chemotherapy   Started Letrozole  2.5mg  twice daily       Current Treatment:  Letrozole   INTERVAL HISTORY:  [name] is here today for follow up.  She experiences hot flashes ocassionally which are manageable and do not interfere with daily activities. She is currently taking letrozole  and reports nausea.  Reports nausea happens daily and is unrelated to when she takes her letrozole .  Reports several episodes of vomiting.  Patient has significantly cut down alcohol consumption. Has been going to gym.  She denies any arthralgias.  She is taking vitamin D and calcium supplement.  Left breast pain has resolved.   She is here for follow-up for elevated liver enzymes.  Reports she still feels and has a tender area on her left breast to the right of her nipple.  She recently had a mammogram diagnostic in August 2025 which was unremarkable.  Appetite and energy levels are  100%.   ALLERGIES:  is allergic to erythromycin, levofloxacin , penicillins, sulfamethoxazole-trimethoprim, and sulfa antibiotics.  MEDICATIONS:  Current Outpatient Medications  Medication Sig Dispense Refill   ALPRAZolam (XANAX) 0.25 MG tablet Take 0.25 mg by mouth at bedtime as needed for anxiety.     Calcium Carbonate (CALCIUM 500 PO) Take by mouth.     cetirizine (ZYRTEC) 10 MG tablet Take 10 mg by mouth daily.     cycloSPORINE (VEVYE) 0.1 % SOLN Apply 1 drop to eye daily.     escitalopram (LEXAPRO) 20 MG tablet      Glucosamine 750 MG TABS Take by mouth.     hydrocortisone (ANUSOL-HC) 2.5 % rectal cream 1 application Externally Twice a day for 30 days     ibuprofen  (ADVIL ) 800 MG tablet Take 1 tablet (800 mg total) by mouth 3 (three) times daily. 21 tablet 0   letrozole  (FEMARA ) 2.5 MG tablet Take 1 tablet (2.5 mg total) by mouth daily. 90 tablet 3   Multiple Vitamin (MULTIVITAMIN) capsule Take 1 capsule  by mouth daily.     ondansetron  (ZOFRAN ) 8 MG tablet Take 1 tablet (8 mg total) by mouth every 8 (eight) hours as needed for nausea. 90 tablet 3   pantoprazole (PROTONIX) 40 MG tablet Take 40 mg by mouth 2 (two) times daily.     valACYclovir (VALTREX) 500 MG tablet      No current facility-administered medications for this visit.   REVIEW OF SYSTEMS:   Review of Systems  Gastrointestinal:  Positive for nausea and vomiting.  Psychiatric/Behavioral:  The patient has insomnia.       VITALS:  Blood pressure 111/86, pulse 72, temperature 98.1 F (36.7 C), temperature source Oral, resp. rate 16, weight 145 lb 8.1 oz (66 kg), last menstrual period 10/17/2014, SpO2 97%.  Wt Readings from Last 3 Encounters:  12/29/23 145 lb 8.1 oz (66 kg)  09/29/23 147 lb 0.8 oz (66.7 kg)  06/22/23 154 lb 12.8 oz (70.2 kg)    Body mass index is 28.42 kg/m.  Performance status (ECOG): 0 - Asymptomatic  PHYSICAL EXAM:   Physical Exam Constitutional:      Appearance: Normal appearance.   HENT:     Head: Normocephalic and atraumatic.  Eyes:     Pupils: Pupils are equal, round, and reactive to light.  Cardiovascular:     Rate and Rhythm: Normal rate and regular rhythm.     Heart sounds: Normal heart sounds. No murmur heard. Pulmonary:     Effort: Pulmonary effort is normal.     Breath sounds: Normal breath sounds. No wheezing.  Chest:       Comments: Tenderness at 9:00 about 1 inch from left nipple.  This was ultrasound in March 2025.  Still tender to touch. Abdominal:     General: Bowel sounds are normal. There is no distension.     Palpations: Abdomen is soft.     Tenderness: There is no abdominal tenderness.  Musculoskeletal:        General: Normal range of motion.     Cervical back: Normal range of motion.  Skin:    General: Skin is warm and dry.     Findings: No rash.  Neurological:     Mental Status: She is alert and oriented to person, place, and time.  Psychiatric:        Judgment: Judgment normal.      LABORATORY DATA:  I have reviewed the data as listed    Component Value Date/Time   NA 139 12/22/2023 0820   K 4.1 12/22/2023 0820   CL 102 12/22/2023 0820   CO2 26 12/22/2023 0820   GLUCOSE 93 12/22/2023 0820   BUN 10 12/22/2023 0820   CREATININE 0.79 12/22/2023 0820   CALCIUM 9.6 12/22/2023 0820   PROT 7.2 12/22/2023 0820   ALBUMIN 4.4 12/22/2023 0820   AST 27 12/22/2023 0820   ALT 38 12/22/2023 0820   ALKPHOS 79 12/22/2023 0820   BILITOT 0.5 12/22/2023 0820   GFRNONAA >60 12/22/2023 0820   GFRAA  01/08/2007 1009    >60        The eGFR has been calculated using the MDRD equation. This calculation has not been validated in all clinical   Lab Results  Component Value Date   WBC 5.3 12/22/2023   NEUTROABS 3.1 12/22/2023   HGB 14.8 12/22/2023   HCT 42.7 12/22/2023   MCV 94.9 12/22/2023   PLT 231 12/22/2023      Chemistry      Component Value Date/Time  NA 139 12/22/2023 0820   K 4.1 12/22/2023 0820   CL 102 12/22/2023 0820    CO2 26 12/22/2023 0820   BUN 10 12/22/2023 0820   CREATININE 0.79 12/22/2023 0820      Component Value Date/Time   CALCIUM 9.6 12/22/2023 0820   ALKPHOS 79 12/22/2023 0820   AST 27 12/22/2023 0820   ALT 38 12/22/2023 0820   BILITOT 0.5 12/22/2023 0820     RADIOLOGICAL IMAGES:   None new to review

## 2023-12-29 NOTE — Assessment & Plan Note (Addendum)
 Mildly elevated, possibly due to Letrozole  and alcohol use.  Slightly elevated even before the start of letrozole  Trended down previously since cutting down alcohol.   No abdominal pain reported. Labs today show: Normal LFTs.  -Continue Letrozole . -Encouraged to limit alcohol intake - Given improvement of liver enzymes, patient does not need ultrasound or referral to GI.

## 2024-02-19 ENCOUNTER — Encounter: Payer: Self-pay | Admitting: *Deleted

## 2024-06-28 ENCOUNTER — Inpatient Hospital Stay

## 2024-10-11 ENCOUNTER — Inpatient Hospital Stay

## 2024-10-18 ENCOUNTER — Inpatient Hospital Stay: Admitting: Oncology
